# Patient Record
Sex: Male | Born: 1950 | Race: White | Hispanic: No | Marital: Married | Smoking: Current every day smoker
Health system: Southern US, Community
[De-identification: ages and names within clinical notes are randomized; demographics above are authoritative.]

## PROBLEM LIST (undated history)

## (undated) DIAGNOSIS — Z973 Presence of spectacles and contact lenses: Secondary | ICD-10-CM

## (undated) DIAGNOSIS — Z9989 Dependence on other enabling machines and devices: Secondary | ICD-10-CM

## (undated) DIAGNOSIS — R7303 Prediabetes: Secondary | ICD-10-CM

## (undated) DIAGNOSIS — G4733 Obstructive sleep apnea (adult) (pediatric): Secondary | ICD-10-CM

## (undated) DIAGNOSIS — I420 Dilated cardiomyopathy: Secondary | ICD-10-CM

## (undated) DIAGNOSIS — E7800 Pure hypercholesterolemia, unspecified: Secondary | ICD-10-CM

## (undated) DIAGNOSIS — F419 Anxiety disorder, unspecified: Secondary | ICD-10-CM

## (undated) DIAGNOSIS — R05 Cough: Secondary | ICD-10-CM

## (undated) DIAGNOSIS — R0609 Other forms of dyspnea: Secondary | ICD-10-CM

## (undated) DIAGNOSIS — J410 Simple chronic bronchitis: Secondary | ICD-10-CM

## (undated) DIAGNOSIS — K219 Gastro-esophageal reflux disease without esophagitis: Secondary | ICD-10-CM

## (undated) DIAGNOSIS — Z720 Tobacco use: Secondary | ICD-10-CM

## (undated) DIAGNOSIS — I10 Essential (primary) hypertension: Secondary | ICD-10-CM

## (undated) DIAGNOSIS — R0600 Dyspnea, unspecified: Secondary | ICD-10-CM

## (undated) HISTORY — PX: CARDIAC CATHETERIZATION: SHX172

## (undated) HISTORY — PX: UMBILICAL HERNIA REPAIR: SHX196

## (undated) HISTORY — DX: Essential (primary) hypertension: I10

## (undated) HISTORY — PX: COLONOSCOPY: SHX174

## (undated) HISTORY — PX: APPENDECTOMY: SHX54

## (undated) HISTORY — DX: Tobacco use: Z72.0

## (undated) HISTORY — DX: Pure hypercholesterolemia, unspecified: E78.00

## (undated) HISTORY — PX: TONSILLECTOMY: SUR1361

## (undated) HISTORY — PX: HEMORRHOID SURGERY: SHX153

## (undated) HISTORY — DX: Dilated cardiomyopathy: I42.0

## (undated) HISTORY — PX: TRANSTHORACIC ECHOCARDIOGRAM: SHX275

---

## 2001-03-20 ENCOUNTER — Encounter: Payer: Self-pay | Admitting: Emergency Medicine

## 2001-03-20 ENCOUNTER — Inpatient Hospital Stay (HOSPITAL_COMMUNITY): Admission: EM | Admit: 2001-03-20 | Discharge: 2001-03-26 | Payer: Self-pay | Admitting: Emergency Medicine

## 2001-03-22 ENCOUNTER — Encounter: Payer: Self-pay | Admitting: Cardiology

## 2001-03-23 ENCOUNTER — Encounter: Payer: Self-pay | Admitting: Cardiology

## 2001-03-25 ENCOUNTER — Encounter: Payer: Self-pay | Admitting: Cardiology

## 2004-11-06 ENCOUNTER — Encounter (INDEPENDENT_AMBULATORY_CARE_PROVIDER_SITE_OTHER): Payer: Self-pay | Admitting: Specialist

## 2004-11-06 ENCOUNTER — Ambulatory Visit (HOSPITAL_COMMUNITY): Admission: RE | Admit: 2004-11-06 | Discharge: 2004-11-06 | Payer: Self-pay | Admitting: General Surgery

## 2010-01-30 ENCOUNTER — Ambulatory Visit: Payer: Self-pay | Admitting: Cardiology

## 2010-02-20 ENCOUNTER — Ambulatory Visit: Admission: RE | Admit: 2010-02-20 | Discharge: 2010-02-20 | Payer: Self-pay | Source: Home / Self Care

## 2010-02-20 ENCOUNTER — Other Ambulatory Visit: Payer: Self-pay | Admitting: Cardiology

## 2010-02-20 ENCOUNTER — Ambulatory Visit (HOSPITAL_COMMUNITY)
Admission: RE | Admit: 2010-02-20 | Discharge: 2010-02-20 | Payer: Self-pay | Source: Home / Self Care | Attending: Cardiology | Admitting: Cardiology

## 2010-07-05 NOTE — H&P (Signed)
[place]. [facility]  Patient:    Antonio Fuller, Antonio Fuller Visit Number: [id number] MRN: [id number]          Service Type: MED Location: 2000 2001 01 Attending Physician:  Eleanora Neighbor Dictated by:   Jennet Maduro Earl Gala, R.N., A.N.P. Admit Date:  03/20/2001   CC:         Carola J. Gerri Spore, M.D.   History and Physical  CHIEF COMPLAINT:  Progressive dyspnea in the setting of lower chest and upper abdominal discomfort.  HISTORY OF PRESENT ILLNESS:  Antonio Fuller is a 60 year old white male who was referred from Dr. Rhona Leavens office with a two-week history of probable URI/cold.  However, over the last week he has had progressive dyspnea with just minimal exertion (i.e., just shampooing hair) as well as abdominal fullness, which he describes as somewhat of a bloating-like feeling.  He had had some amoxicillin at home when he self-medicated, with possibly some improvement; however, when he went to the physicians office this morning basically for his sinus and head congestion, his EKG there was noted to be abnormal and he was referred on to the emergency room.  He is currently pain free and in no acute distress.  PAST MEDICAL HISTORY: 1. Hypertension over the past 5-6 years. 2. Ongoing tobacco abuse. 3. Hypercholesterolemia. 4. History of appendectomy. 5. Tonsillectomy. 6. Umbilical hernia repair. 7. History of sleep apnea.  ALLERGIES:  None.  CURRENT MEDICINES: 1. Zestoretic 20-25 daily. 2. Paxil 20 mg a day. 3. Baby aspirin daily. 4. Amoxicillin recently.  SOCIAL HISTORY:  He is married.  Wife is the medical examiner.  He is a Comptroller at [employer].  For the past 15 years, he smokes one pack a day.  FAMILY HISTORY:  Both his parents are alive and in mid-39s.  Both have hypertension.  His father has had valve replacement.  His mother has a heart murmur.  He has three brothers who have hypertension and two sisters who are alive and well.  REVIEW  OF SYSTEMS:  Basically as noted above.  In general he has no prior history of chest pain, presyncope, or shortness of breath.  He has had no complaints of palpitations, no peripheral edema, no trouble with head, eyes, ears, nose, throat, no faints or seizures, negative GI/GU system.  Otherwise review of systems is as noted above.  PHYSICAL EXAMINATION:  GENERAL:  Currently in no acute distress.  VITAL SIGNS:  Blood pressure 116/73, heart rate is 92-100, respirations 18. He is afebrile.  O2 saturations 97%.  NECK:  Supple.  LUNGS:  Show a few crackles in the bases.  CARDIAC:  Cardiac exam shows a tachycardic rhythm, yet regular.  ABDOMEN:  Obese, yet soft.  Positive bowel sounds without masses.  EXTREMITIES:  Without edema.  NEUROLOGIC:  Intact.  LABORATORY:  Labs are all pending.  His EKG from Dr. Rhona Leavens office does show ST elevation in leads V2 and V3 with ST depression in V5 and V6.  These are new from previous tracings in 1999.  However, EKG here in the emergency room is nonacute at this point in time and just shows nonspecific changes.  OVERALL IMPRESSION: 1. Progressive dyspnea with abdominal and lower chest fullness. 2. Abnormal EKG. 3. Recent upper respiratory infection. 4. Hypertension. 5. Ongoing tobacco abuse. 6. Hypercholesterolemia.  PLAN:  Will go ahead and admit for evaluation to telemetry.  Will start IV nitroglycerin and heparin.  Lab studies have been drawn and are currently pending.  Further plan to  follow per Dr. Angelina Pih discretion. Dictated by:   Jennet Maduro Earl Gala, R.N., A.N.P. Attending Physician:  Eleanora Neighbor DD:  03/20/01 TD:  03/20/01 Job: [id number]

## 2010-07-05 NOTE — Discharge Summary (Signed)
[place]. [facility]  Patient:    Antonio Fuller, Antonio Fuller Visit Number: [id number] MRN: [id number]          Service Type: MED Location: 2000 2001 01 Attending Physician:  Eleanora Neighbor Dictated by:   Jennet Maduro Earl Gala, R.N., A.N.P. Admit Date:  03/20/2001 Discharge Date: 03/26/2001   CC:         [facility]  Carola J. Gerri Spore, M.D.   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  SECONDARY DISCHARGE DIAGNOSES: 1. Recent upper respiratory infection/bronchitis. 2. Ongoing substance abuse (tobacco and alcohol). 3. Hypertensive heart disease.  HISTORY OF PRESENT ILLNESS:  Antonio Fuller is a 60 year old white male who was referred from Dr. Dietrich Pates office after having a two week history of cold-like symptoms.  He had also had a feeling of abdominal fullness and bloating and had been self-medicating with amoxicillin with basically no improvement.  When he was seen in Dr. Dietrich Pates office, he was noted to have an abnormal EKG and was referred on to the emergency room.  Please see the dictated history and physical for the patient presentation and profile.  LABORATORY DATA:  On admission, his cardiac enzymes were negative.  He had a B-peptide drawn which was greater than 1300, however.  His chemistries on admission showed sodium 142, potassium 3.9, chloride 109, CO2 24, BUN 22, creatinine 1.0, and a glucose of 109.  His AST was elevated at 60, ALT was 90.  Chest x-ray showed cardiomegaly with moderate bilateral edema.  HOSPITAL COURSE:  The patient was referred on for admission to the hospital. He was initially placed on IV nitroglycerin and IV heparin.  He did rule out negative for myocardial infarction per serial enzymes.  A B-peptide was drawn and was noted to be greater than 1300.  He was referred  on for cardiac catheterization on 03/22/01 which demonstrated an enlarged left ventricle with an ejection fraction of approximately 20 percent.  The left ventricular end diastolic pressure was approximately 40.  The coronaries were noted to be normal.  At that time, it was felt that Antonio Fuller was suffering from a dilated cardiomyopathy and the plan was to continue supportive therapy with diuresis.  He was diuresed with a very nice response.  He has lost approximately 15 pounds now during this admission.  His potassium has been replaced accordingly.  We have proceeded on with 2-D echocardiogram as well as MUGA scan to further quantify this problem.  Two-D echocardiogram showed an ejection fraction estimated in the range of 15-25 percent.  There was severe, diffuse, left ventricular hypokinesis with moderate mitral valvular regurgitation.  The left atrium was mildly to moderately dilated and the right ventricular systolic function was mildly reduced as well.  A resting cardiac MUGA scan showed cardiomegaly and diffuse left ventricular hypokinesis.  The calculated ejection fraction was 15 percent.  Today on 03/26/01, the patient has done well.  He has diuresed quite nicely and is perhaps a little bit "on the dry" side.  BUN is now 33 with a creatinine of 1.2.  Potassium remains satisfactory.  He has been loaded with Coumadin and INR is currently subtherapeutic but we will continue to follow this as an outpatient.  His blood pressure has ranged anywhere from 80-100 systolic and he has remained asymptomatic.  He is currently tolerating his medical regimen well and has had no recurrence of shortness of breath  and is felt to be stable for discharge today.  DISCHARGE CONDITION:  Stable.  DISCHARGE MEDICATIONS:  We will be stopping the Zestoretic he previously took. He will start Coumadin 5 mg a day, Zestril 20 mg a day, Paxil 20 mg a day, potassium 20 mEq b.i.d., Lasix 40 mg each day to be  started on Saturday, Lanoxin 0.25 mg daily, and an aspirin daily until he has been told to stop.  His activity is to be light over the next several weeks.  Diet is no extra salt.  SPECIAL INSTRUCTIONS:  He is to weigh himself daily and to record those readings.  He will come to the office on Monday and Thursday next week for protimes.  He is strongly encouraged not to smoke as well as not to consume any alcohol products.  He will be seen in the office next Wednesday at 11 a.m. or sooner if any problems would arise. Dictated by:   Jennet Maduro Earl Gala, R.N., A.N.P. Attending Physician:  Eleanora Neighbor DD:  03/26/01 TD:  03/27/01 Job: [id number]

## 2010-07-05 NOTE — Op Note (Signed)
NAMEMARSHON, Antonio Fuller               ACCOUNT NO.:  [id number]   MEDICAL RECORD NO.:  [id number]          PATIENT TYPE:  AMB   LOCATION:  SDS                          FACILITY:  [facility]   PHYSICIAN:  Sharlet Salina T. Hoxworth, M.D.DATE OF BIRTH:  September 06, 1950   DATE OF PROCEDURE:  11/06/2004  DATE OF DISCHARGE:  11/06/2004                                 OPERATIVE REPORT   PREOPERATIVE DIAGNOSIS:  Thrombosed prolapsed hemorrhoid.   POSTOP DIAGNOSIS:  Thrombosed prolapsed hemorrhoid.   PROCEDURE:  Hemorrhoidectomy.   SURGEON:  Lorne Skeens. Hoxworth, M.D.   ANESTHESIA:  General.   BRIEF HISTORY:  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  Examination reveals a large thrombosed a  partially necrotic prolapsed internal hemorrhoid. Hemorrhoidectomy under  general anesthesia been recommended. The nature of the procedure,  indications, risks of bleeding, infection were discussed and understood. Now  brought to operating room for this procedure.   DESCRIPTION OF PROCEDURE:  The patient was brought to the operating room,  placed supine position on operating table and laryngeal mask general  anesthesia was induced. He is carefully positioned in lithotomy position.  The perineum sterilely prepped and draped. A rectal retractor was placed and  examination of the anal canal showed a single large prolapsed thrombosed  internal hemorrhoid in the right lateral position. There was some moderate  diffuse internal hemorrhoids but nothing particularly severe at this time. I  felt that this would be best addressed with a simple hemorrhoidectomy of the  large prolapsed thrombosed hemorrhoid and subsequent PPH procedure if  necessary to deal with his chronic symptoms. A 2-0 chromic stitch was placed  at the apex of the hemorrhoid group. An elliptical excision was then  performed, dissecting  this off of the sphincter carefully protecting it and  the entire hemorrhoid group removed. The wound was then closed with the  chromic suture in running lock fashion. There was complete hemostasis.  The  area was infiltrated with Marcaine.  Sponge, needle and instrument counts were correct. Sterile dressing was  applied. The patient taken recovery in satisfactory condition.      Lorne Skeens. Hoxworth, M.D.  Electronically Signed     BTH/MEDQ  D:  11/06/2004  T:  11/06/2004  Job:  [id number]

## 2010-07-05 NOTE — Cardiovascular Report (Signed)
[place]. [facility]  Patient:    Antonio Fuller, Antonio Fuller Visit Number: [id number] MRN: [id number]          Service Type: MED Location: 2000 2001 01 Attending Physician:  Eleanora Neighbor Dictated by:   Colleen Can. Deborah Chalk, M.D. Proc. Date: 03/22/01 Admit Date:  03/20/2001   CC:         [facility]  [facility]   Cardiac Catheterization  HISTORY: The patient is a 60 year old male, presented with progressive shortness of breath. He is referred for evaluation of coronary anatomy.  PROCEDURE: Left heart catheterization with selective coronary angiography, left ventricular angiography.  TYPE AND SITE OF ENTRY: Percutaneous right femoral artery with Perclose.  CATHETERS: A 6 French 4 curved Judkins right and left coronary catheters, 6 French pigtail ventriculographic catheter.  CONTRAST MATERIAL: Omnipaque.  MEDICATIONS GIVEN PRIOR TO THE PROCEDURE: Valium 10 mg p.o.  MEDICATIONS GIVEN DURING THE PROCEDURE: Versed 5 mg IV.  COMMENTS: The patient tolerated the procedure well.  HEMODYNAMIC DATA: The aortic pressure was 106/78, LV pressure was 107/40.  The pressure was zeroed and this elevated end-diastolic pressure is confirmed.  ANGIOGRAPHIC DATA: 1. Right coronary artery is a dominant vessel. It is normal. 2. Left main coronary artery is normal. 3. Left circumflex is a reasonably large vessel. It is normal. 4. Left anterior descending: The left anterior descending is a reasonably    large vessel that crosses the apex. It is normal.  LEFT VENTRICULAR ANGIOGRAM: Left ventricular angiogram was performed in the RAO position using 35 cc of contrast at 15 cc/sec. The cardiac silhouette was markedly enlarged even on the lower field. There was global hypokinesis. The global ejection fraction would be estimated to be 20%. There was no intracardiac filling defect, intracardiac calcification. There is equivocal mitral regurgitation  present.  OVERALL IMPRESSION: 1. Severe left ventricular dysfunction in a global manner. 2. Normal coronary arteries.  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 Dictated by:   Colleen Can Deborah Chalk, M.D. Attending Physician:  Eleanora Neighbor DD:  03/22/01 TD:  03/23/01 Job: [id number]

## 2010-09-12 ENCOUNTER — Other Ambulatory Visit: Payer: Self-pay | Admitting: Cardiology

## 2010-09-12 NOTE — Telephone Encounter (Signed)
escribe medication per fax request  

## 2010-09-21 ENCOUNTER — Other Ambulatory Visit: Payer: Self-pay | Admitting: Cardiology

## 2010-09-23 ENCOUNTER — Other Ambulatory Visit: Payer: Self-pay | Admitting: *Deleted

## 2010-09-23 MED ORDER — ATORVASTATIN CALCIUM 20 MG PO TABS: 20.0000 mg | ORAL_TABLET | Freq: Every day | ORAL | Status: DC

## 2010-09-23 MED ORDER — EZETIMIBE 10 MG PO TABS: 10.0000 mg | ORAL_TABLET | Freq: Every day | ORAL | Status: DC

## 2010-09-23 NOTE — Telephone Encounter (Signed)
Fax received from pharmacy. Refill completed. Jodette Nafeesah Lapaglia RN  

## 2010-10-30 ENCOUNTER — Encounter: Payer: Self-pay | Admitting: Cardiology

## 2010-10-31 ENCOUNTER — Ambulatory Visit (INDEPENDENT_AMBULATORY_CARE_PROVIDER_SITE_OTHER): Payer: Self-pay | Admitting: Cardiology

## 2010-10-31 ENCOUNTER — Encounter: Payer: Self-pay | Admitting: Cardiology

## 2010-10-31 DIAGNOSIS — I5022 Chronic systolic (congestive) heart failure: Secondary | ICD-10-CM

## 2010-10-31 DIAGNOSIS — F17200 Nicotine dependence, unspecified, uncomplicated: Secondary | ICD-10-CM

## 2010-10-31 DIAGNOSIS — I428 Other cardiomyopathies: Secondary | ICD-10-CM

## 2010-10-31 DIAGNOSIS — I10 Essential (primary) hypertension: Secondary | ICD-10-CM

## 2010-10-31 DIAGNOSIS — Z79899 Other long term (current) drug therapy: Secondary | ICD-10-CM

## 2010-10-31 DIAGNOSIS — Z720 Tobacco use: Secondary | ICD-10-CM

## 2010-10-31 DIAGNOSIS — I420 Dilated cardiomyopathy: Secondary | ICD-10-CM

## 2010-10-31 DIAGNOSIS — E7800 Pure hypercholesterolemia, unspecified: Secondary | ICD-10-CM

## 2010-10-31 DIAGNOSIS — E669 Obesity, unspecified: Secondary | ICD-10-CM

## 2010-10-31 DIAGNOSIS — G4730 Sleep apnea, unspecified: Secondary | ICD-10-CM

## 2010-10-31 NOTE — Assessment & Plan Note (Signed)
The patient understands the need to lose weight with diet and exercise. We have discussed specific strategies for this.  

## 2010-10-31 NOTE — Patient Instructions (Signed)
Your physician has requested that you have an echocardiogram in 6 months. Echocardiography is a painless test that uses sound waves to create images of your heart. It provides your doctor with information about the size and shape of your heart and how well your heart's chambers and valves are working. This procedure takes approximately one hour. There are no restrictions for this procedure.  Please come fasting in 6 months for blood work to check your cholesterol.  The current medical regimen is effective;  continue present plan and medications.  Follow up in 6 months with Dr Antoine Poche.  You will receive a letter in the mail 2 months before you are due.  Please call us when you receive this letter to schedule your follow up appointment.

## 2010-10-31 NOTE — Assessment & Plan Note (Signed)
For now he has no heart failure symptoms. He is on a reasonable regimen. We discussed the need to stop drinking alcohol completely and he thinks he can do this. We also discussed the possibility of treating his sleep apnea. He does not want a sleep consult were to consider CPAP at this time.

## 2010-10-31 NOTE — Progress Notes (Signed)
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  The LAD this was felt possibly to be related to alcohol. For a while he did stop this but he has resumed. His most recent echocardiogram in January of this year demonstrated ischemia and 45 it sent. Several years ago it was as low as 15% improving to 60% and now slightly lower than this. He however reports no symptoms. The patient denies any new symptoms such as chest discomfort, neck or arm discomfort. There has been no new shortness of breath, PND or orthopnea. There have been no reported palpitations, presyncope or syncope.  He continues to smoke.  He does not exercise.  No Known Allergies  Current Outpatient Prescriptions  Medication Sig Dispense Refill  . aspirin 81 MG tablet Take 81 mg by mouth daily.        Marland Kitchen atorvastatin (LIPITOR) 20 MG tablet take 1 tablet by mouth once daily  30 tablet  01  . carvedilol (COREG) 25 MG tablet Take 25 mg by mouth 2 (two) times daily with a meal.        . digoxin (LANOXIN) 0.25 MG tablet Take 250 mcg by mouth daily.        Marland Kitchen ezetimibe (ZETIA) 10 MG tablet Take 1 tablet (10 mg total) by mouth daily.  30 tablet  01  . furosemide (LASIX) 40 MG tablet take 1 tablet by mouth once daily  30 tablet  4  . lisinopril (PRINIVIL,ZESTRIL) 20 MG tablet Take 20 mg by mouth daily.        . Multiple Vitamin (MULTIVITAMIN) tablet Take 1 tablet by mouth daily.        Marland Kitchen PARoxetine (PAXIL-CR) 25 MG 24 hr tablet Take 25 mg by mouth 2 (two) times daily.        . potassium chloride (KLOR-CON) 20 MEQ packet Take 20 mEq by mouth daily.          Past Medical History  Diagnosis Date  . Hypertension   . Tobacco user   . Hypercholesteremia   . Sleep apnea     Past Surgical History  Procedure Date  . Tonsillectomy   . Appendectomy   . Hernia repair   . Appendectomy     ROS:  As stated in the HPI and negative for all other systems.  PHYSICAL EXAM BP 130/91  Pulse 74  Resp 18  Ht 6\' 2"  (1.88 m)   Wt 247 lb (112.038 kg)  BMI 31.71 kg/m2 GENERAL:  Well appearing HEENT:  Pupils equal round and reactive, fundi not visualized, oral mucosa unremarkable NECK:  No jugular venous distention, waveform within normal limits, carotid upstroke brisk and symmetric, no bruits, no thyromegaly LYMPHATICS:  No cervical, inguinal adenopathy LUNGS:  Clear to auscultation bilaterally BACK:  No CVA tenderness CHEST:  Unremarkable HEART:  PMI not displaced or sustained,S1 and S2 within normal limits, no S3, no S4, no clicks, no rubs, no murmurs ABD:  Flat, positive bowel sounds normal in frequency in pitch, no bruits, no rebound, no guarding, no midline pulsatile mass, no hepatomegaly, no splenomegaly EXT:  2 plus pulses throughout, no edema, no cyanosis no clubbing SKIN:  No rashes no nodules NEURO:  Cranial nerves II through XII grossly intact, motor grossly intact throughout PSYCH:  Cognitively intact, oriented to person place and time  EKG:  Sinus rhythm, rate 77, axis within normal limits, left ventricular hypertrophy by voltage criteria, no acute ST-T wave changes  ASSESSMENT AND PLAN

## 2010-10-31 NOTE — Assessment & Plan Note (Signed)
The blood pressure is at target. No change in medications is indicated. We will continue with therapeutic lifestyle changes (TLC).  

## 2010-10-31 NOTE — Assessment & Plan Note (Signed)
We discussed at length the need to stop smoking.  He will consider this.

## 2011-02-13 ENCOUNTER — Other Ambulatory Visit: Payer: Self-pay | Admitting: Cardiology

## 2011-02-13 ENCOUNTER — Other Ambulatory Visit: Payer: Self-pay | Admitting: Nurse Practitioner

## 2011-02-13 MED ORDER — ATORVASTATIN CALCIUM 20 MG PO TABS: 20.0000 mg | ORAL_TABLET | Freq: Every day | ORAL | Status: DC

## 2011-02-13 MED ORDER — EZETIMIBE 10 MG PO TABS: 10.0000 mg | ORAL_TABLET | Freq: Every day | ORAL | Status: DC

## 2011-02-25 ENCOUNTER — Other Ambulatory Visit: Payer: Self-pay | Admitting: *Deleted

## 2011-02-25 MED ORDER — FUROSEMIDE 40 MG PO TABS: 40.0000 mg | ORAL_TABLET | Freq: Every day | ORAL | Status: DC

## 2011-02-27 ENCOUNTER — Other Ambulatory Visit: Payer: Self-pay | Admitting: Cardiology

## 2011-02-27 NOTE — Telephone Encounter (Signed)
Refilled lanoxin 

## 2011-03-17 ENCOUNTER — Other Ambulatory Visit: Payer: Self-pay | Admitting: Cardiology

## 2011-03-19 ENCOUNTER — Other Ambulatory Visit: Payer: Self-pay | Admitting: *Deleted

## 2011-03-19 MED ORDER — PAROXETINE HCL ER 25 MG PO TB24: 25.0000 mg | ORAL_TABLET | Freq: Two times a day (BID) | ORAL | Status: DC

## 2011-03-27 ENCOUNTER — Other Ambulatory Visit: Payer: Self-pay | Admitting: Cardiology

## 2011-04-16 ENCOUNTER — Other Ambulatory Visit: Payer: Self-pay | Admitting: Cardiology

## 2011-04-16 NOTE — Telephone Encounter (Signed)
..   Requested Prescriptions   Pending Prescriptions Disp Refills  . atorvastatin (LIPITOR) 20 MG tablet [Pharmacy Med Name: ATORVASTATIN 20 MG TABLET] 30 tablet 0    Sig: take 1 tablet by mouth once daily  . ZETIA 10 MG tablet [Pharmacy Med Name: ZETIA 10 MG TABLET] 30 tablet 0    Sig: take 1 tablet by mouth once daily

## 2011-05-19 ENCOUNTER — Other Ambulatory Visit: Payer: Self-pay | Admitting: Cardiology

## 2011-07-26 ENCOUNTER — Other Ambulatory Visit: Payer: Self-pay | Admitting: Cardiology

## 2011-08-19 ENCOUNTER — Other Ambulatory Visit: Payer: Self-pay | Admitting: Cardiology

## 2011-08-21 ENCOUNTER — Other Ambulatory Visit: Payer: Self-pay | Admitting: Cardiology

## 2011-08-22 ENCOUNTER — Other Ambulatory Visit: Payer: Self-pay | Admitting: *Deleted

## 2011-08-22 NOTE — Telephone Encounter (Signed)
Not sure if Dr. Antoine Poche refills paxil will route to Nurse

## 2011-10-01 ENCOUNTER — Other Ambulatory Visit: Payer: Self-pay | Admitting: Cardiology

## 2011-10-02 ENCOUNTER — Telehealth: Payer: Self-pay

## 2011-10-02 NOTE — Telephone Encounter (Signed)
..   Requested Prescriptions   Pending Prescriptions Disp Refills  . LANOXIN 0.25 MG tablet [Pharmacy Med Name: LANOXIN 250MCG(0.25MG ) TABLET] 30 tablet 1    Sig: take 1 tablet by mouth once daily  .Marland KitchenPatient needs to contact office to schedule  Appointment  for future refills.Ph:[phone]. Thank you.

## 2011-10-17 ENCOUNTER — Other Ambulatory Visit: Payer: Self-pay | Admitting: Cardiology

## 2011-10-17 NOTE — Telephone Encounter (Signed)
Pt needs appointment then refill can be made Fax Received. Refill Completed. Eagle Pitta Chowoe (R.M.A)   

## 2011-11-01 ENCOUNTER — Other Ambulatory Visit: Payer: Self-pay | Admitting: Cardiology

## 2011-11-03 ENCOUNTER — Telehealth: Payer: Self-pay

## 2011-11-03 NOTE — Telephone Encounter (Signed)
Spoke with patient to make appointment for oct 11 At 400pm

## 2011-11-03 NOTE — Telephone Encounter (Signed)
..   Requested Prescriptions   Pending Prescriptions Disp Refills  . LANOXIN 0.25 MG tablet [Pharmacy Med Name: LANOXIN 250MCG(0.25MG ) TABLET] 30 each 2    Sig: take 1 tablet by mouth once daily  Spoke with patient to make appointment for oct 11 At 400pm

## 2011-11-28 ENCOUNTER — Encounter: Payer: Self-pay | Admitting: Cardiology

## 2011-11-28 ENCOUNTER — Ambulatory Visit (INDEPENDENT_AMBULATORY_CARE_PROVIDER_SITE_OTHER): Payer: BC Managed Care – PPO | Admitting: Cardiology

## 2011-11-28 VITALS — BP 102/80 | HR 73 | Ht 74.0 in | Wt 241.8 lb

## 2011-11-28 DIAGNOSIS — I428 Other cardiomyopathies: Secondary | ICD-10-CM

## 2011-11-28 NOTE — Progress Notes (Signed)
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  The etiology this was felt possibly to be related to alcohol.   His most recent echocardiogram in January of last year demonstrated an EF of 45%. Several years ago it was as low as 15% improving at one point to 60%.   Since I last saw him he has done well.  The patient denies any new symptoms such as chest discomfort, neck or arm discomfort. There has been no new shortness of breath, PND or orthopnea. There have been no reported palpitations, presyncope or syncope.  He continues to smoke.  However, he stop drinking and has had nothing to drink in a month. He's lost some weight. He is starting to exercise.  No Known Allergies  Current Outpatient Prescriptions  Medication Sig Dispense Refill  . aspirin 81 MG tablet Take 81 mg by mouth daily.        Marland Kitchen atorvastatin (LIPITOR) 20 MG tablet Take 1 tablet (20 mg total) by mouth daily.  30 tablet  1  . carvedilol (COREG) 25 MG tablet take 1 tablet by mouth twice a day  60 tablet  1  . fluticasone (FLONASE) 50 MCG/ACT nasal spray       . furosemide (LASIX) 40 MG tablet take 1 tablet by mouth once daily  30 tablet  9  . LANOXIN 0.25 MG tablet take 1 tablet by mouth once daily  30 tablet  1  . lisinopril (PRINIVIL,ZESTRIL) 20 MG tablet take 1 tablet by mouth once daily  30 tablet  1  . Multiple Vitamin (MULTIVITAMIN) tablet Take 1 tablet by mouth daily.        Marland Kitchen PARoxetine (PAXIL-CR) 25 MG 24 hr tablet Take 1 tablet (25 mg total) by mouth 2 (two) times daily.  60 tablet  2  . potassium chloride (KLOR-CON) 20 MEQ packet Take 20 mEq by mouth daily.        Marland Kitchen ZETIA 10 MG tablet take 1 tablet by mouth once daily  30 tablet  6  . DISCONTD: atorvastatin (LIPITOR) 20 MG tablet take 1 tablet by mouth once daily  30 tablet  6  . DISCONTD: LANOXIN 0.25 MG tablet take 1 tablet by mouth once daily  30 each  2    Past Medical History  Diagnosis Date  . Hypertension   . Tobacco user   .  Hypercholesteremia   . Sleep apnea     No CPAP  . Cardiomyopathy, dilated, nonischemic     Past Surgical History  Procedure Date  . Tonsillectomy   . Appendectomy   . Hernia repair     ROS:  Chronic sinus problems.  Otherwise as stated in the HPI and negative for all other systems.  PHYSICAL EXAM BP 102/80  Pulse 73  Ht 6\' 2"  (1.88 m)  Wt 241 lb 12.8 oz (109.68 kg)  BMI 31.05 kg/m2 GENERAL:  Well appearing HEENT:  Pupils equal round and reactive, fundi not visualized, oral mucosa unremarkable NECK:  No jugular venous distention, waveform within normal limits, carotid upstroke brisk and symmetric, no bruits, no thyromegaly LYMPHATICS:  No cervical, inguinal adenopathy LUNGS:  Clear to auscultation bilaterally BACK:  No CVA tenderness CHEST:  Unremarkable HEART:  PMI not displaced or sustained,S1 and S2 within normal limits, no S3, no S4, no clicks, no rubs, no murmurs ABD:  Flat, positive bowel sounds normal in frequency in pitch, no bruits, no rebound, no guarding, no midline pulsatile mass, no hepatomegaly, no  splenomegaly EXT:  2 plus pulses throughout, no edema, no cyanosis no clubbing SKIN:  No rashes no nodules NEURO:  Cranial nerves II through XII grossly intact, motor grossly intact throughout PSYCH:  Cognitively intact, oriented to person place and time  EKG:  Sinus rhythm, rate 73, axis within normal limits, left ventricular hypertrophy by voltage criteria, no acute ST-T wave changes.  11/28/2011   ASSESSMENT AND PLAN  Cardiomyopathy, dilated, nonischemic -  The patient has no new symptoms. He will continue the medications as listed. He seems to be euvolemic. No change in therapy is indicated.   Hypertension -  The blood pressure is at target. No change in medications is indicated. We will continue with therapeutic lifestyle changes (TLC).   Tobacco user -  We discussed at length the need to stop smoking. He is going to try to cut back in one vise and time and  right now cannot stop smoking.  Obesity -  I congratulated him on his weight loss and I encourage more of the same.  ETOH - I congratulated him on the fact that he has abstained from alcohol for one month and I encouraged complete continued abstinence.  Chronic sinus trouble - I did give him the name for ENT referral. In addition he needs a new primary care doctor as he is retired and I provided him with another referral.

## 2011-11-28 NOTE — Patient Instructions (Addendum)
The current medical regimen is effective;  continue present plan and medications.  Follow up in 1 year with Dr Antoine Poche.  You will receive a letter in the mail 2 months before you are due.  Please call us when you receive this letter to schedule your follow up appointment.  Dr Osborn Coho  - ENT   [phone]  Dr Blair Heys - primary care  [phone]

## 2011-12-18 ENCOUNTER — Other Ambulatory Visit: Payer: Self-pay | Admitting: Cardiology

## 2011-12-18 NOTE — Telephone Encounter (Signed)
..   Requested Prescriptions   Pending Prescriptions Disp Refills  . atorvastatin (LIPITOR) 20 MG tablet [Pharmacy Med Name: ATORVASTATIN 20 MG TABLET] 30 tablet 6    Sig: take 1 tablet by mouth once daily  . ZETIA 10 MG tablet [Pharmacy Med Name: ZETIA 10 MG TABLET] 30 each 6    Sig: take 1 tablet by mouth once daily  . lisinopril (PRINIVIL,ZESTRIL) 20 MG tablet [Pharmacy Med Name: LISINOPRIL 20 MG TABLET] 30 tablet 6    Sig: take 1 tablet by mouth once daily  . carvedilol (COREG) 25 MG tablet [Pharmacy Med Name: CARVEDILOL 25 MG TABLET] 60 tablet 6    Sig: take 1 tablet by mouth twice a day

## 2012-02-25 ENCOUNTER — Other Ambulatory Visit: Payer: Self-pay | Admitting: Cardiology

## 2012-03-27 ENCOUNTER — Other Ambulatory Visit: Payer: Self-pay | Admitting: Cardiology

## 2012-04-07 ENCOUNTER — Other Ambulatory Visit: Payer: Self-pay | Admitting: Cardiology

## 2012-04-13 ENCOUNTER — Telehealth: Payer: Self-pay | Admitting: Cardiology

## 2012-04-13 NOTE — Telephone Encounter (Signed)
Pt needs to get a pcp and Dr. Antoine Poche gave him a couple of names but he has forgotten and would like you to ask Dr. Antoine Poche and give him a call wit this information

## 2012-04-13 NOTE — Telephone Encounter (Signed)
Will ask Dr Antoine Poche and call pt back

## 2012-04-14 ENCOUNTER — Telehealth: Payer: Self-pay

## 2012-04-14 NOTE — Telephone Encounter (Signed)
Patient walked in office wanting Paroxetine 25 mg refilled.Patient was told Dr.Hochrein not in office today will forward message to him for approval.

## 2012-04-14 NOTE — Telephone Encounter (Signed)
I do not prescribe his Paxil.

## 2012-04-14 NOTE — Telephone Encounter (Signed)
I don't remember the names I gave him.  We can give him the names of MDs at the nearest [place] office to where he lives.

## 2012-04-15 MED ORDER — PAROXETINE HCL ER 25 MG PO TB24: 25.0000 mg | ORAL_TABLET | Freq: Two times a day (BID) | ORAL | Status: DC

## 2012-04-15 NOTE — Telephone Encounter (Signed)
New Prob   Pt states pharm said he can not get a refill on one of his medications until he comes in for an office visit. Would like to speak to nurse.

## 2012-04-15 NOTE — Telephone Encounter (Signed)
Please see other office note. 

## 2012-04-15 NOTE — Telephone Encounter (Signed)
Pt aware Dr Antoine Poche will not continue to refill- he must est with a PCP to continue to receive Paxil.  #60 only time only sent into pharmacy.  He was given the name of MD's Dr Antoine Poche had given him before - Dr Manus Gunning and Dr Annalee Genta for ENT issues.

## 2012-05-28 ENCOUNTER — Other Ambulatory Visit: Payer: Self-pay | Admitting: Cardiology

## 2012-07-08 ENCOUNTER — Other Ambulatory Visit: Payer: Self-pay | Admitting: Cardiology

## 2012-07-25 ENCOUNTER — Other Ambulatory Visit: Payer: Self-pay | Admitting: Cardiology

## 2012-07-30 ENCOUNTER — Other Ambulatory Visit: Payer: Self-pay | Admitting: *Deleted

## 2012-07-30 MED ORDER — LISINOPRIL 20 MG PO TABS: ORAL_TABLET | ORAL | Status: DC

## 2012-08-20 ENCOUNTER — Other Ambulatory Visit: Payer: Self-pay | Admitting: Cardiology

## 2012-09-17 ENCOUNTER — Other Ambulatory Visit: Payer: Self-pay | Admitting: Cardiology

## 2012-10-17 ENCOUNTER — Other Ambulatory Visit: Payer: Self-pay | Admitting: Cardiology

## 2012-12-17 ENCOUNTER — Other Ambulatory Visit: Payer: Self-pay | Admitting: Cardiology

## 2012-12-21 ENCOUNTER — Other Ambulatory Visit: Payer: Self-pay

## 2012-12-21 MED ORDER — CARVEDILOL 25 MG PO TABS: ORAL_TABLET | ORAL | Status: DC

## 2012-12-21 MED ORDER — ATORVASTATIN CALCIUM 20 MG PO TABS: 20.0000 mg | ORAL_TABLET | Freq: Every day | ORAL | Status: DC

## 2012-12-22 ENCOUNTER — Other Ambulatory Visit: Payer: Self-pay | Admitting: Gastroenterology

## 2013-01-26 ENCOUNTER — Other Ambulatory Visit: Payer: Self-pay | Admitting: Cardiology

## 2013-02-03 ENCOUNTER — Ambulatory Visit (INDEPENDENT_AMBULATORY_CARE_PROVIDER_SITE_OTHER): Payer: BC Managed Care – PPO | Admitting: Cardiology

## 2013-02-03 ENCOUNTER — Encounter: Payer: Self-pay | Admitting: Cardiology

## 2013-02-03 VITALS — BP 128/82 | HR 89 | Ht 74.0 in | Wt 247.8 lb

## 2013-02-03 DIAGNOSIS — I10 Essential (primary) hypertension: Secondary | ICD-10-CM

## 2013-02-03 DIAGNOSIS — G4730 Sleep apnea, unspecified: Secondary | ICD-10-CM

## 2013-02-03 DIAGNOSIS — R0989 Other specified symptoms and signs involving the circulatory and respiratory systems: Secondary | ICD-10-CM

## 2013-02-03 DIAGNOSIS — I420 Dilated cardiomyopathy: Secondary | ICD-10-CM

## 2013-02-03 DIAGNOSIS — Z720 Tobacco use: Secondary | ICD-10-CM

## 2013-02-03 DIAGNOSIS — F17200 Nicotine dependence, unspecified, uncomplicated: Secondary | ICD-10-CM

## 2013-02-03 DIAGNOSIS — I428 Other cardiomyopathies: Secondary | ICD-10-CM

## 2013-02-03 NOTE — Progress Notes (Signed)
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  The etiology this was felt possibly to be related to alcohol.   His most recent echocardiogram in January of 2012 demonstrated an EF of 45%. Several years ago it was as low as 15%.   Since I last saw him he has done OK.  The patient denies any new symptoms such as chest discomfort, neck or arm discomfort. There has been no new shortness of breath, PND or orthopnea. There have been no reported palpitations, presyncope or syncope.  He continues to smoke but is now thinking about quitting. Last year he told me that he had quit drinking ETOH.  However, he still drinks occasionally.    No Known Allergies  Current Outpatient Prescriptions  Medication Sig Dispense Refill  . aspirin 81 MG tablet Take 81 mg by mouth daily.        Marland Kitchen atorvastatin (LIPITOR) 20 MG tablet Take 1 tablet (20 mg total) by mouth daily.  30 tablet  1  . carvedilol (COREG) 25 MG tablet take 1 tablet by mouth twice a day PATIENT NEEDS to keep APPOINTMENT  60 tablet  1  . fluticasone (FLONASE) 50 MCG/ACT nasal spray       . furosemide (LASIX) 40 MG tablet take 1 tablet by mouth once daily  30 tablet  1  . KLOR-CON M20 20 MEQ tablet take 1 tablet by mouth once daily  30 tablet  5  . LANOXIN 250 MCG tablet take 1 tablet by mouth once daily  30 tablet  0  . lisinopril (PRINIVIL,ZESTRIL) 40 MG tablet Take 40 mg by mouth daily.      . Multiple Vitamin (MULTIVITAMIN) tablet Take 1 tablet by mouth daily.        Marland Kitchen PARoxetine (PAXIL-CR) 25 MG 24 hr tablet Take 1 tablet (25 mg total) by mouth 2 (two) times daily.  60 tablet  0  . ZETIA 10 MG tablet take 1 tablet by mouth once daily  30 tablet  6   No current facility-administered medications for this visit.    Past Medical History  Diagnosis Date  . Hypertension   . Tobacco user   . Hypercholesteremia   . Sleep apnea     No CPAP  . Cardiomyopathy, dilated, nonischemic     Past Surgical History  Procedure  Laterality Date  . Tonsillectomy    . Appendectomy    . Hernia repair      ROS:  Chronic sinus problems, mild ongoing chest congestion.  Otherwise as stated in the HPI and negative for all other systems.  PHYSICAL EXAM BP 128/82  Pulse 89  Ht 6\' 2"  (1.88 m)  Wt 247 lb 12.8 oz (112.401 kg)  BMI 31.80 kg/m2 GENERAL:  Well appearing HEENT:  Pupils equal round and reactive, fundi not visualized, oral mucosa unremarkable NECK:  No jugular venous distention, waveform within normal limits, carotid upstroke brisk and symmetric, no bruits, no thyromegaly LUNGS:  Clear to auscultation bilaterally BACK:  No CVA tenderness HEART:  PMI not displaced or sustained,S1 and S2 within normal limits, no S3, no S4, no clicks, no rubs, no murmurs ABD:  Flat, positive bowel sounds normal in frequency in pitch, no bruits, no rebound, no guarding, no midline pulsatile mass, no hepatomegaly, no splenomegaly EXT:  2 plus pulses throughout, no edema, no cyanosis no clubbing  EKG:  Sinus rhythm, rate 89, axis within normal limits, left ventricular hypertrophy by voltage criteria,nonspecific ST-T wave changes.  02/03/2013  ASSESSMENT AND PLAN  Cardiomyopathy, dilated, nonischemic -  The patient has no new symptoms. He will continue the medications as listed. He seems to be euvolemic. No change in therapy is indicated.   Hypertension -  The blood pressure is at target. No change in medications is indicated. We will continue with therapeutic lifestyle changes (TLC).   Tobacco user -  We discussed at length the need to stop smoking. He has a prescription for Chantix and is thinking of using this.   Obesity -  He understands the need to lose weight.   ETOH - He needs to avoid alcohol given his cardiac problems.    Chronic sinus trouble - I again gave him a name to an ENT.

## 2013-02-03 NOTE — Patient Instructions (Signed)
The current medical regimen is effective;  continue present plan and medications.  You have been referred to Dr Osborn Coho for your sinus issues.  Follow up in 1 year with Dr Antoine Poche.  You will receive a letter in the mail 2 months before you are due.  Please call us when you receive this letter to schedule your follow up appointment.

## 2013-02-08 ENCOUNTER — Other Ambulatory Visit: Payer: Self-pay | Admitting: Cardiology

## 2013-02-28 ENCOUNTER — Other Ambulatory Visit: Payer: Self-pay | Admitting: Cardiology

## 2013-03-04 ENCOUNTER — Other Ambulatory Visit: Payer: Self-pay

## 2013-03-04 MED ORDER — LANOXIN 250 MCG PO TABS: ORAL_TABLET | ORAL | Status: DC

## 2013-03-18 ENCOUNTER — Other Ambulatory Visit: Payer: Self-pay | Admitting: Cardiology

## 2013-04-12 ENCOUNTER — Other Ambulatory Visit: Payer: Self-pay | Admitting: Cardiology

## 2013-05-20 ENCOUNTER — Other Ambulatory Visit: Payer: Self-pay | Admitting: Cardiology

## 2013-06-08 ENCOUNTER — Other Ambulatory Visit: Payer: Self-pay | Admitting: Cardiology

## 2013-07-06 ENCOUNTER — Other Ambulatory Visit: Payer: Self-pay | Admitting: Cardiology

## 2013-07-19 ENCOUNTER — Other Ambulatory Visit: Payer: Self-pay | Admitting: Cardiology

## 2013-08-07 ENCOUNTER — Other Ambulatory Visit: Payer: Self-pay | Admitting: Cardiology

## 2013-09-04 ENCOUNTER — Other Ambulatory Visit: Payer: Self-pay | Admitting: Cardiology

## 2013-09-05 NOTE — Telephone Encounter (Signed)
Rollene RotundaJames Hochrein, MD at 02/03/2013  2:37 PM KLOR-CON M20 20 MEQ tablet  take 1 tablet by mouth once daily  30 tablet  ZETIA 10 MG tablet  take 1 tablet by mouth once daily  The current medical regimen is effective;  continue present plan and medications.

## 2013-09-09 ENCOUNTER — Other Ambulatory Visit: Payer: Self-pay | Admitting: Cardiology

## 2013-10-04 ENCOUNTER — Other Ambulatory Visit: Payer: Self-pay | Admitting: Cardiology

## 2013-11-13 ENCOUNTER — Other Ambulatory Visit: Payer: Self-pay | Admitting: Cardiology

## 2013-11-15 ENCOUNTER — Other Ambulatory Visit: Payer: Self-pay | Admitting: Family Medicine

## 2013-12-02 ENCOUNTER — Other Ambulatory Visit: Payer: Self-pay | Admitting: Cardiology

## 2014-01-08 ENCOUNTER — Other Ambulatory Visit: Payer: Self-pay | Admitting: Cardiology

## 2014-02-02 ENCOUNTER — Other Ambulatory Visit: Payer: Self-pay | Admitting: Cardiology

## 2014-02-07 ENCOUNTER — Other Ambulatory Visit: Payer: Self-pay | Admitting: Cardiology

## 2014-02-20 ENCOUNTER — Ambulatory Visit (INDEPENDENT_AMBULATORY_CARE_PROVIDER_SITE_OTHER): Payer: BLUE CROSS/BLUE SHIELD | Admitting: Cardiology

## 2014-02-20 ENCOUNTER — Other Ambulatory Visit: Payer: Self-pay | Admitting: *Deleted

## 2014-02-20 ENCOUNTER — Encounter: Payer: Self-pay | Admitting: Cardiology

## 2014-02-20 VITALS — BP 130/84 | HR 67 | Ht 74.0 in | Wt 233.5 lb

## 2014-02-20 DIAGNOSIS — I420 Dilated cardiomyopathy: Secondary | ICD-10-CM

## 2014-02-20 NOTE — Patient Instructions (Signed)
Your physician recommends that you schedule a follow-up appointment in: one year with Dr. Hochrein  We have ordered an echo for you to get done   

## 2014-02-20 NOTE — Progress Notes (Signed)
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  The etiology this was felt possibly to be related to alcohol.   His most recent echocardiogram in January of 2012 demonstrated an EF of 45%. Several years ago it was as low as 15%.   Since I last saw him he has done OK.  The patient denies any new symptoms such as chest discomfort, neck or arm discomfort. There has been no new shortness of breath, PND or orthopnea. There have been no reported palpitations, presyncope or syncope.   He has lost some weight and is doing some exercising routinely.  He still smokes however.  I did review his outside lipids and A1c and they were at target.  No Known Allergies  Current Outpatient Prescriptions  Medication Sig Dispense Refill  . aspirin 81 MG tablet Take 81 mg by mouth daily.      Marland Kitchen atorvastatin (LIPITOR) 20 MG tablet take 1 tablet by mouth once daily 30 tablet 1  . carvedilol (COREG) 25 MG tablet take 1 tablet by mouth twice a day 60 tablet 1  . fluticasone (FLONASE) 50 MCG/ACT nasal spray     . furosemide (LASIX) 40 MG tablet TAKE 1 TABLET ONCE DAILY 30 tablet 0  . LANOXIN 250 MCG tablet take 1 tablet by mouth once daily 30 tablet 0  . lisinopril (PRINIVIL,ZESTRIL) 40 MG tablet Take 40 mg by mouth daily.    . Multiple Vitamin (MULTIVITAMIN) tablet Take 1 tablet by mouth daily.      Marland Kitchen PARoxetine (PAXIL-CR) 25 MG 24 hr tablet Take 1 tablet (25 mg total) by mouth 2 (two) times daily. 60 tablet 0  . potassium chloride SA (K-DUR,KLOR-CON) 20 MEQ tablet take 1 tablet by mouth once daily 30 tablet 5  . ZETIA 10 MG tablet take 1 tablet by mouth once daily 30 tablet 6   No current facility-administered medications for this visit.    Past Medical History  Diagnosis Date  . Hypertension   . Tobacco user   . Hypercholesteremia   . Sleep apnea     No CPAP  . Cardiomyopathy, dilated, nonischemic     Past Surgical History  Procedure Laterality Date  . Tonsillectomy    .  Appendectomy    . Hernia repair      ROS:  Chronic sinus problems, mild ongoing chest congestion.  Otherwise as stated in the HPI and negative for all other systems.  PHYSICAL EXAM BP 130/84 mmHg  Pulse 67  Ht  (1.88 m)  Wt 233 lb 8 oz (105.915 kg)  BMI 29.97 kg/m2 GENERAL:  Well appearing HEENT:  Pupils equal round and reactive, fundi not visualized, oral mucosa unremarkable NECK:  No jugular venous distention, waveform within normal limits, carotid upstroke brisk and symmetric, no bruits, no thyromegaly LUNGS:  Clear to auscultation bilaterally BACK:  No CVA tenderness HEART:  PMI not displaced or sustained,S1 and S2 within normal limits, no S3, no S4, no clicks, no rubs, no murmurs ABD:  Flat, positive bowel sounds normal in frequency in pitch, no bruits, no rebound, no guarding, no midline pulsatile mass, no hepatomegaly, no splenomegaly EXT:  2 plus pulses throughout, no edema, no cyanosis no clubbing  EKG:  Sinus rhythm, rate 67, axis within normal limits, left ventricular hypertrophy by voltage criteria,nonspecific ST-T wave changes.  02/20/2014  ASSESSMENT AND PLAN  Cardiomyopathy, dilated, nonischemic -  The patient has no new symptoms. It has been four years since his echo and I  will repeat this.    Hypertension -  The blood pressure is at target. No change in medications is indicated. We will continue with therapeutic lifestyle changes (TLC).   Tobacco user -  We discussed again the need to stop smoking.  He has had a prescription for Chantix but has never used this.   Obesity -  He has lost weight and I applauded this.   ETOH - He needs to avoid alcohol given his cardiac problems.

## 2014-02-23 ENCOUNTER — Ambulatory Visit (HOSPITAL_COMMUNITY)
Admission: RE | Admit: 2014-02-23 | Discharge: 2014-02-23 | Disposition: A | Discharge status: Home / Self Care | Payer: BLUE CROSS/BLUE SHIELD | Source: Ambulatory Visit | Attending: Cardiovascular Disease | Admitting: Cardiovascular Disease

## 2014-02-23 DIAGNOSIS — I429 Cardiomyopathy, unspecified: Secondary | ICD-10-CM | POA: Insufficient documentation

## 2014-02-23 DIAGNOSIS — I10 Essential (primary) hypertension: Secondary | ICD-10-CM | POA: Insufficient documentation

## 2014-02-23 DIAGNOSIS — Z720 Tobacco use: Secondary | ICD-10-CM | POA: Diagnosis not present

## 2014-02-23 DIAGNOSIS — E785 Hyperlipidemia, unspecified: Secondary | ICD-10-CM | POA: Diagnosis not present

## 2014-02-23 DIAGNOSIS — I517 Cardiomegaly: Secondary | ICD-10-CM

## 2014-02-23 DIAGNOSIS — I420 Dilated cardiomyopathy: Secondary | ICD-10-CM

## 2014-02-23 NOTE — Progress Notes (Signed)
2D Echocardiogram Complete.  02/23/2014   Angel Weedon, RDCS  

## 2014-03-06 ENCOUNTER — Other Ambulatory Visit: Payer: Self-pay | Admitting: Cardiology

## 2014-03-07 ENCOUNTER — Encounter: Payer: Self-pay | Admitting: *Deleted

## 2014-03-07 ENCOUNTER — Telehealth: Payer: Self-pay | Admitting: Cardiology

## 2014-03-07 NOTE — Telephone Encounter (Signed)
Pt returning call for his echo from a few days ago.

## 2014-03-07 NOTE — Telephone Encounter (Signed)
Dr. Jenene SlickerHochrein's summary of results read, patient voiced understanding.

## 2014-03-18 ENCOUNTER — Other Ambulatory Visit: Payer: Self-pay | Admitting: Cardiology

## 2014-03-20 NOTE — Telephone Encounter (Signed)
Rx refill sent to patient pharmacy   

## 2014-04-02 ENCOUNTER — Other Ambulatory Visit: Payer: Self-pay | Admitting: Cardiology

## 2014-04-03 ENCOUNTER — Other Ambulatory Visit: Payer: Self-pay | Admitting: Cardiology

## 2014-05-01 ENCOUNTER — Encounter: Payer: Self-pay | Admitting: *Deleted

## 2014-05-03 ENCOUNTER — Other Ambulatory Visit: Payer: Self-pay

## 2014-05-03 MED ORDER — LANOXIN 250 MCG PO TABS: 250.0000 ug | ORAL_TABLET | Freq: Every day | ORAL | Status: DC

## 2014-05-22 ENCOUNTER — Other Ambulatory Visit: Payer: Self-pay | Admitting: Cardiology

## 2014-12-26 ENCOUNTER — Other Ambulatory Visit: Payer: Self-pay | Admitting: Cardiology

## 2015-02-24 ENCOUNTER — Other Ambulatory Visit: Payer: Self-pay | Admitting: Cardiology

## 2015-02-26 NOTE — Telephone Encounter (Signed)
Rx request sent to pharmacy.  

## 2015-03-02 ENCOUNTER — Ambulatory Visit (INDEPENDENT_AMBULATORY_CARE_PROVIDER_SITE_OTHER): Payer: BLUE CROSS/BLUE SHIELD | Admitting: Cardiology

## 2015-03-02 ENCOUNTER — Encounter: Payer: Self-pay | Admitting: Cardiology

## 2015-03-02 VITALS — BP 126/76 | HR 72 | Ht 74.0 in | Wt 235.4 lb

## 2015-03-02 DIAGNOSIS — I420 Dilated cardiomyopathy: Secondary | ICD-10-CM

## 2015-03-02 NOTE — Patient Instructions (Signed)

## 2015-03-02 NOTE — Progress Notes (Signed)
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  The etiology this was felt possibly to be related to alcohol.   His most recent echocardiogram in January of 2012 demonstrated an EF of 45%. Several years ago it was as low as 15%.   Since I last saw him he has done OK.  His mother died.  The patient denies any new symptoms such as chest discomfort, neck or arm discomfort. There has been no new shortness of breath, PND or orthopnea. There have been no reported palpitations, presyncope or syncope.     No Known Allergies  Current Outpatient Prescriptions  Medication Sig Dispense Refill  . aspirin 81 MG tablet Take 81 mg by mouth daily.      Marland Kitchen atorvastatin (LIPITOR) 20 MG tablet take 1 tablet by mouth once daily 30 tablet 0  . carvedilol (COREG) 25 MG tablet take 1 tablet by mouth twice a day 60 tablet 0  . fluticasone (FLONASE) 50 MCG/ACT nasal spray     . furosemide (LASIX) 40 MG tablet take 1 tablet by mouth once daily 30 tablet 0  . LANOXIN 250 MCG tablet Take 1 tablet (250 mcg total) by mouth daily. 30 tablet 11  . lisinopril (PRINIVIL,ZESTRIL) 40 MG tablet Take 40 mg by mouth daily.    . Multiple Vitamin (MULTIVITAMIN) tablet Take 1 tablet by mouth daily.      Marland Kitchen PARoxetine (PAXIL-CR) 25 MG 24 hr tablet Take 1 tablet (25 mg total) by mouth 2 (two) times daily. 60 tablet 0  . potassium chloride SA (K-DUR,KLOR-CON) 20 MEQ tablet take 1 tablet by mouth once daily 30 tablet 0  . ZETIA 10 MG tablet take 1 tablet by mouth once daily 30 tablet 0   No current facility-administered medications for this visit.    Past Medical History  Diagnosis Date  . Hypertension   . Tobacco user   . Hypercholesteremia   . Sleep apnea     No CPAP  . Cardiomyopathy, dilated, nonischemic [facility])     Past Surgical History  Procedure Laterality Date  . Tonsillectomy    . Appendectomy    . Hernia repair      ROS:  Chronic sinus problems.  Otherwise as stated in the HPI and  negative for all other systems.  PHYSICAL EXAM BP 126/76 mmHg  Pulse 72  Ht 6\' 2"  (1.88 m)  Wt 235 lb 6 oz (106.765 kg)  BMI 30.21 kg/m2 GENERAL:  Well appearing HEENT:  Pupils equal round and reactive, fundi not visualized, oral mucosa unremarkable NECK:  No jugular venous distention, waveform within normal limits, carotid upstroke brisk and symmetric, no bruits, no thyromegaly LUNGS:  Clear to auscultation bilaterally BACK:  No CVA tenderness HEART:  PMI not displaced or sustained,S1 and S2 within normal limits, no S3, no S4, no clicks, no rubs, no murmurs ABD:  Flat, positive bowel sounds normal in frequency in pitch, no bruits, no rebound, no guarding, no midline pulsatile mass, no hepatomegaly, no splenomegaly EXT:  2 plus pulses throughout, no edema, no cyanosis no clubbing  EKG:  Sinus rhythm, rate 72, axis within normal limits, left ventricular hypertrophy by voltage criteria,nonspecific ST-T wave changes.  03/02/2015  ASSESSMENT AND PLAN  Cardiomyopathy, dilated, nonischemic -  He has no new symptoms.  No change in therapy is planned.  No further imaging is planned.   Hypertension -  The blood pressure is at target. No change in medications is indicated. We will continue with therapeutic  lifestyle changes (TLC).   Tobacco user -  We discussed again the need to stop smoking.  He did not tolerate Chantix.  I would suggest nicotine replacement  ETOH - He needs to avoid alcohol given his cardiac problems.   He does still drink occasionally on the weekend.

## 2015-03-26 ENCOUNTER — Other Ambulatory Visit: Payer: Self-pay | Admitting: *Deleted

## 2015-03-26 MED ORDER — FUROSEMIDE 40 MG PO TABS: 40.0000 mg | ORAL_TABLET | Freq: Every day | ORAL | Status: DC

## 2015-03-26 MED ORDER — EZETIMIBE 10 MG PO TABS
10.0000 mg | ORAL_TABLET | Freq: Every day | ORAL | Status: DC
Start: 2015-03-26 — End: 2016-02-25

## 2015-03-26 MED ORDER — POTASSIUM CHLORIDE CRYS ER 20 MEQ PO TBCR: 20.0000 meq | EXTENDED_RELEASE_TABLET | Freq: Every day | ORAL | Status: DC

## 2015-03-26 MED ORDER — CARVEDILOL 25 MG PO TABS: 25.0000 mg | ORAL_TABLET | Freq: Two times a day (BID) | ORAL | Status: DC

## 2015-03-26 MED ORDER — ATORVASTATIN CALCIUM 20 MG PO TABS: 20.0000 mg | ORAL_TABLET | Freq: Every day | ORAL | Status: DC

## 2015-04-24 ENCOUNTER — Other Ambulatory Visit: Payer: Self-pay

## 2015-04-24 MED ORDER — LANOXIN 250 MCG PO TABS: 250.0000 ug | ORAL_TABLET | Freq: Every day | ORAL | Status: DC

## 2015-05-21 ENCOUNTER — Other Ambulatory Visit: Payer: Self-pay | Admitting: Family Medicine

## 2015-05-21 ENCOUNTER — Ambulatory Visit
Admission: RE | Admit: 2015-05-21 | Discharge: 2015-05-21 | Disposition: A | Discharge status: Home / Self Care | Payer: BC Managed Care – PPO | Source: Ambulatory Visit | Attending: Family Medicine | Admitting: Family Medicine

## 2015-05-21 DIAGNOSIS — R079 Chest pain, unspecified: Secondary | ICD-10-CM

## 2015-05-31 ENCOUNTER — Telehealth: Payer: Self-pay | Admitting: *Deleted

## 2015-05-31 ENCOUNTER — Other Ambulatory Visit: Payer: Self-pay | Admitting: *Deleted

## 2015-05-31 MED ORDER — DIGOXIN 250 MCG PO TABS: 0.2500 mg | ORAL_TABLET | Freq: Every day | ORAL | Status: DC

## 2015-05-31 MED ORDER — DIGOXIN 250 MCG PO TABS: 250.0000 ug | ORAL_TABLET | Freq: Every day | ORAL | Status: DC

## 2015-05-31 NOTE — Telephone Encounter (Signed)
Discussed with Di Kindlehris B NP and ok to change to generic Patient aware of change

## 2015-05-31 NOTE — Telephone Encounter (Signed)
Patient returning call. Has been unable to get through to [employer] and is [facility] [employer] [employer] [facility] each time.  Will be more available after 1 pm at work # [phone] or can receive an Email at [email or web address] any time.

## 2015-05-31 NOTE — Telephone Encounter (Signed)
Lawrence Marseillesrista, pharmacist from [facility] called and stated that the patient was on lanoxin and an rx was sent in today for digoxin. She requested a call back from the nurse stating that Dr Antoine PocheHochrein is ok with this change due to the class of the drug. She can be reached at [phone]. Thanks, MI

## 2015-05-31 NOTE — Telephone Encounter (Signed)
Left message to call back  

## 2016-02-06 DIAGNOSIS — J343 Hypertrophy of nasal turbinates: Secondary | ICD-10-CM | POA: Insufficient documentation

## 2016-02-06 DIAGNOSIS — R198 Other specified symptoms and signs involving the digestive system and abdomen: Secondary | ICD-10-CM | POA: Insufficient documentation

## 2016-02-06 DIAGNOSIS — J392 Other diseases of pharynx: Secondary | ICD-10-CM | POA: Insufficient documentation

## 2016-02-06 DIAGNOSIS — R0989 Other specified symptoms and signs involving the circulatory and respiratory systems: Secondary | ICD-10-CM | POA: Insufficient documentation

## 2016-02-06 DIAGNOSIS — J342 Deviated nasal septum: Secondary | ICD-10-CM | POA: Insufficient documentation

## 2016-02-25 ENCOUNTER — Other Ambulatory Visit: Payer: Self-pay | Admitting: Cardiology

## 2016-02-26 NOTE — Telephone Encounter (Signed)
Rx has been sent to the pharmacy electronically. ° °

## 2016-03-03 NOTE — Progress Notes (Deleted)
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  The etiology this was felt possibly to be related to alcohol.   His most recent echocardiogram in January of 2012 demonstrated an EF of 45%. Several years ago it was as low as 15%.   Since I last saw him he ***    has done OK.  His mother died.  The patient denies any new symptoms such as chest discomfort, neck or arm discomfort. There has been no new shortness of breath, PND or orthopnea. There have been no reported palpitations, presyncope or syncope.     No Known Allergies  Current Outpatient Prescriptions  Medication Sig Dispense Refill  . aspirin 81 MG tablet Take 81 mg by mouth daily.      Marland Kitchen atorvastatin (LIPITOR) 20 MG tablet Take 1 tablet (20 mg total) by mouth daily. 90 tablet 3  . carvedilol (COREG) 25 MG tablet Take 1 tablet (25 mg total) by mouth 2 (two) times daily. 180 tablet 3  . digoxin (LANOXIN) 0.25 MG tablet Take 1 tablet (250 mcg total) by mouth daily. 30 tablet 11  . ezetimibe (ZETIA) 10 MG tablet take 1 tablet by mouth daily 90 tablet 0  . fluticasone (FLONASE) 50 MCG/ACT nasal spray     . furosemide (LASIX) 40 MG tablet Take 1 tablet (40 mg total) by mouth daily. 90 tablet 3  . lisinopril (PRINIVIL,ZESTRIL) 40 MG tablet Take 40 mg by mouth daily.    . Multiple Vitamin (MULTIVITAMIN) tablet Take 1 tablet by mouth daily.      Marland Kitchen PARoxetine (PAXIL-CR) 25 MG 24 hr tablet Take 1 tablet (25 mg total) by mouth 2 (two) times daily. 60 tablet 0  . potassium chloride SA (K-DUR,KLOR-CON) 20 MEQ tablet Take 1 tablet (20 mEq total) by mouth daily. 90 tablet 3   No current facility-administered medications for this visit.     Past Medical History:  Diagnosis Date  . Cardiomyopathy, dilated, nonischemic (HCC)   . Hypercholesteremia   . Hypertension   . Sleep apnea    No CPAP  . Tobacco user     Past Surgical History:  Procedure Laterality Date  . APPENDECTOMY    . HERNIA REPAIR    .  TONSILLECTOMY      ROS:  Chronic ***.  Otherwise as stated in the HPI and negative for all other systems.  PHYSICAL EXAM There were no vitals taken for this visit. GENERAL:  Well appearing HEENT:  Pupils equal round and reactive, fundi not visualized, oral mucosa unremarkable NECK:  No jugular venous distention, waveform within normal limits, carotid upstroke brisk and symmetric, no bruits, no thyromegaly LUNGS:  Clear to auscultation bilaterally BACK:  No CVA tenderness HEART:  PMI not displaced or sustained,S1 and S2 within normal limits, no S3, no S4, no clicks, no rubs, no murmurs ABD:  Flat, positive bowel sounds normal in frequency in pitch, no bruits, no rebound, no guarding, no midline pulsatile mass, no hepatomegaly, no splenomegaly EXT:  2 plus pulses throughout, no edema, no cyanosis no clubbing  EKG:  Sinus rhythm, rate ***, axis within normal limits, left ventricular hypertrophy by voltage criteria,nonspecific ST-T wave changes.  03/03/2016  ASSESSMENT AND PLAN  Cardiomyopathy, dilated, nonischemic -  He has no new symptoms.  No change in therapy is planned.  No further imaging is planned.   Hypertension -  The blood pressure is at target. No change in medications is indicated. We will continue with therapeutic lifestyle changes (  TLC).   Tobacco user -  We discussed again the need to stop smoking.  He did not tolerate Chantix.  I would suggest nicotine replacement  ETOH - He needs to avoid alcohol given his cardiac problems.   He does still drink occasionally on the weekend.

## 2016-03-05 ENCOUNTER — Ambulatory Visit: Payer: BC Managed Care – PPO | Admitting: Cardiology

## 2016-03-19 ENCOUNTER — Other Ambulatory Visit: Payer: Self-pay | Admitting: Cardiology

## 2016-03-30 NOTE — Progress Notes (Signed)
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  The etiology this was felt possibly to be related to alcohol.   His most recent echocardiogram in January of 2012 demonstrated an EF of 45%. Several years ago it was as low as 15%.   Since I last saw him he has done well from a cardiovascular standpoint. He does have a deviated septum and apparently is going to have Gen. surgery to treat this later this week. He does have problems with breathing through his nose. He otherwise denies any cardiovascular symptoms. He does some walking around [place] where he works. He goes up and down stairs. He has functional level that is at least 5 METS routinely.  The patient denies any new symptoms such as chest discomfort, neck or arm discomfort. There has been no new shortness of breath, PND or orthopnea. There have been no reported palpitations, presyncope or syncope.  No Known Allergies  Current Outpatient Prescriptions  Medication Sig Dispense Refill  . amLODipine (NORVASC) 5 MG tablet Take 1 tablet by mouth daily.    Marland Kitchen aspirin 81 MG tablet Take 81 mg by mouth daily.      Marland Kitchen atorvastatin (LIPITOR) 20 MG tablet take 1 tablet by mouth once daily 30 tablet 1  . carvedilol (COREG) 25 MG tablet take 1 tablet by mouth twice a day 30 tablet 1  . digoxin (LANOXIN) 0.25 MG tablet Take 1 tablet (250 mcg total) by mouth daily. 30 tablet 11  . ezetimibe (ZETIA) 10 MG tablet take 1 tablet by mouth daily 90 tablet 0  . fluticasone (FLONASE) 50 MCG/ACT nasal spray     . lisinopril (PRINIVIL,ZESTRIL) 40 MG tablet Take 40 mg by mouth daily.    . Multiple Vitamin (MULTIVITAMIN) tablet Take 1 tablet by mouth daily.      Marland Kitchen PARoxetine (PAXIL-CR) 25 MG 24 hr tablet Take 1 tablet (25 mg total) by mouth 2 (two) times daily. 60 tablet 0  . potassium chloride SA (K-DUR,KLOR-CON) 20 MEQ tablet take 1 tablet by mouth once daily 30 tablet 1   No current facility-administered medications for this visit.      Past Medical History:  Diagnosis Date  . Cardiomyopathy, dilated, nonischemic (HCC)   . Hypercholesteremia   . Hypertension   . Sleep apnea    No CPAP  . Tobacco user     Past Surgical History:  Procedure Laterality Date  . APPENDECTOMY    . HERNIA REPAIR    . TONSILLECTOMY      ROS:  Chronic nasal congestion.  Otherwise as stated in the HPI and negative for all other systems.  PHYSICAL EXAM BP (!) 132/92   Pulse 72   Ht 6\' 2"  (1.88 m)   Wt 227 lb (103 kg)   BMI 29.15 kg/m  GENERAL:  Well appearing HEENT:  Pupils equal round and reactive, fundi not visualized, oral mucosa unremarkable NECK:  No jugular venous distention, waveform within normal limits, carotid upstroke brisk and symmetric, no bruits, no thyromegaly LUNGS:  Clear to auscultation bilaterally BACK:  No CVA tenderness HEART:  PMI not displaced or sustained,S1 and S2 within normal limits, no S3, no S4, no clicks, no rubs, no murmurs ABD:  Flat, positive bowel sounds normal in frequency in pitch, no bruits, no rebound, no guarding, no midline pulsatile mass, no hepatomegaly, no splenomegaly EXT:  2 plus pulses throughout, no edema, no cyanosis no clubbing  EKG:  Sinus rhythm, rate 72, axis within normal limits, left  ventricular hypertrophy by voltage criteria,nonspecific ST-T wave changes.  03/31/2016  ASSESSMENT AND PLAN  Cardiomyopathy, dilated, nonischemic -  He has no new symptoms since his echo in 2016.  No change in therapy is planned.  No further imaging is planned.  Note that at the last visit I did have him wean off his Lasix and he's had no swelling or shortness of breath.   Hypertension -  The blood pressure is at target. Norvasc was recently added.   No change in medications is indicated. We will continue with therapeutic lifestyle changes (TLC).   Tobacco user -  We discussed again the need to stop smoking.  He did not tolerate Chantix.  He is going to work on this but wants no further  prescriptions.   ETOH - He needs to avoid alcohol competely given his cardiac problems.   He does still drink occasionally.    PREOP - The patient has a high functional level. He is not going for a high-risk procedure from a cardiovascular standpoint. He has no high-risk findings or features. Therefore, based on ACC/AHA guidelines, the patient would be at acceptable risk for the planned procedure without further cardiovascular testing.

## 2016-03-31 ENCOUNTER — Encounter: Payer: Self-pay | Admitting: Cardiology

## 2016-03-31 ENCOUNTER — Ambulatory Visit (INDEPENDENT_AMBULATORY_CARE_PROVIDER_SITE_OTHER): Payer: BC Managed Care – PPO | Admitting: Cardiology

## 2016-03-31 VITALS — BP 132/92 | HR 72 | Ht 74.0 in | Wt 227.0 lb

## 2016-03-31 DIAGNOSIS — Z01810 Encounter for preprocedural cardiovascular examination: Secondary | ICD-10-CM

## 2016-03-31 DIAGNOSIS — I429 Cardiomyopathy, unspecified: Secondary | ICD-10-CM

## 2016-03-31 DIAGNOSIS — I420 Dilated cardiomyopathy: Secondary | ICD-10-CM

## 2016-03-31 NOTE — Patient Instructions (Signed)

## 2016-04-26 ENCOUNTER — Other Ambulatory Visit: Payer: Self-pay | Admitting: Cardiology

## 2016-04-26 ENCOUNTER — Other Ambulatory Visit: Payer: Self-pay | Admitting: Physician Assistant

## 2016-04-28 NOTE — Telephone Encounter (Signed)
Please review for refills. Thank you! 

## 2016-05-18 ENCOUNTER — Other Ambulatory Visit: Payer: Self-pay | Admitting: Physician Assistant

## 2016-05-18 ENCOUNTER — Other Ambulatory Visit: Payer: Self-pay | Admitting: Cardiology

## 2016-05-19 NOTE — Telephone Encounter (Signed)
Refill Request.  

## 2016-06-16 ENCOUNTER — Other Ambulatory Visit: Payer: Self-pay | Admitting: Cardiology

## 2016-06-17 NOTE — Telephone Encounter (Signed)
REFILL 

## 2016-09-10 DIAGNOSIS — G4733 Obstructive sleep apnea (adult) (pediatric): Secondary | ICD-10-CM | POA: Insufficient documentation

## 2016-09-10 DIAGNOSIS — J310 Chronic rhinitis: Secondary | ICD-10-CM | POA: Insufficient documentation

## 2016-10-16 ENCOUNTER — Other Ambulatory Visit: Payer: Self-pay | Admitting: Cardiology

## 2016-12-10 ENCOUNTER — Ambulatory Visit: Payer: Self-pay | Admitting: Surgery

## 2016-12-30 ENCOUNTER — Other Ambulatory Visit: Payer: Self-pay

## 2016-12-30 ENCOUNTER — Encounter (HOSPITAL_BASED_OUTPATIENT_CLINIC_OR_DEPARTMENT_OTHER): Payer: Self-pay | Admitting: *Deleted

## 2016-12-30 NOTE — Progress Notes (Signed)
NPO AFTER MN.  ARRIVE AT 0530.  NEEDS ISTAT 8 .  CURRENT EKG IN CHART AND Epic.  WILL TAKE COREG, NORVASC, AND DIGITEK AN DOS W/ SIPS OF WATER.  PT VERBALIZED UNDERSTANDING RECTAL PREP.

## 2017-01-02 ENCOUNTER — Encounter (HOSPITAL_BASED_OUTPATIENT_CLINIC_OR_DEPARTMENT_OTHER): Payer: Self-pay | Admitting: *Deleted

## 2017-01-02 ENCOUNTER — Other Ambulatory Visit: Payer: Self-pay

## 2017-01-02 ENCOUNTER — Encounter (HOSPITAL_BASED_OUTPATIENT_CLINIC_OR_DEPARTMENT_OTHER)
Admission: RE | Disposition: A | Discharge status: Home / Self Care | Payer: Self-pay | Source: Ambulatory Visit | Attending: Surgery

## 2017-01-02 ENCOUNTER — Ambulatory Visit (HOSPITAL_BASED_OUTPATIENT_CLINIC_OR_DEPARTMENT_OTHER): Payer: BC Managed Care – PPO | Admitting: Anesthesiology

## 2017-01-02 ENCOUNTER — Ambulatory Visit (HOSPITAL_BASED_OUTPATIENT_CLINIC_OR_DEPARTMENT_OTHER)
Admission: RE | Admit: 2017-01-02 | Discharge: 2017-01-02 | Disposition: A | Discharge status: Home / Self Care | Payer: BC Managed Care – PPO | Source: Ambulatory Visit | Attending: Surgery | Admitting: Surgery

## 2017-01-02 DIAGNOSIS — B356 Tinea cruris: Secondary | ICD-10-CM | POA: Insufficient documentation

## 2017-01-02 DIAGNOSIS — E7800 Pure hypercholesterolemia, unspecified: Secondary | ICD-10-CM | POA: Diagnosis not present

## 2017-01-02 DIAGNOSIS — K648 Other hemorrhoids: Secondary | ICD-10-CM | POA: Diagnosis not present

## 2017-01-02 DIAGNOSIS — Z79899 Other long term (current) drug therapy: Secondary | ICD-10-CM | POA: Insufficient documentation

## 2017-01-02 DIAGNOSIS — G4730 Sleep apnea, unspecified: Secondary | ICD-10-CM | POA: Insufficient documentation

## 2017-01-02 DIAGNOSIS — Z7951 Long term (current) use of inhaled steroids: Secondary | ICD-10-CM | POA: Diagnosis not present

## 2017-01-02 DIAGNOSIS — K643 Fourth degree hemorrhoids: Secondary | ICD-10-CM

## 2017-01-02 DIAGNOSIS — F419 Anxiety disorder, unspecified: Secondary | ICD-10-CM | POA: Insufficient documentation

## 2017-01-02 DIAGNOSIS — L988 Other specified disorders of the skin and subcutaneous tissue: Secondary | ICD-10-CM | POA: Insufficient documentation

## 2017-01-02 DIAGNOSIS — K5909 Other constipation: Secondary | ICD-10-CM | POA: Diagnosis not present

## 2017-01-02 DIAGNOSIS — Z7982 Long term (current) use of aspirin: Secondary | ICD-10-CM | POA: Diagnosis not present

## 2017-01-02 DIAGNOSIS — F17210 Nicotine dependence, cigarettes, uncomplicated: Secondary | ICD-10-CM | POA: Insufficient documentation

## 2017-01-02 DIAGNOSIS — F329 Major depressive disorder, single episode, unspecified: Secondary | ICD-10-CM | POA: Diagnosis not present

## 2017-01-02 HISTORY — DX: Presence of spectacles and contact lenses: Z97.3

## 2017-01-02 HISTORY — DX: Other forms of dyspnea: R06.09

## 2017-01-02 HISTORY — DX: Dyspnea, unspecified: R06.00

## 2017-01-02 HISTORY — PX: MASS EXCISION: SHX2000

## 2017-01-02 HISTORY — DX: Anxiety disorder, unspecified: F41.9

## 2017-01-02 HISTORY — DX: Cough: R05

## 2017-01-02 HISTORY — DX: Gastro-esophageal reflux disease without esophagitis: K21.9

## 2017-01-02 HISTORY — DX: Obstructive sleep apnea (adult) (pediatric): G47.33

## 2017-01-02 HISTORY — PX: EVALUATION UNDER ANESTHESIA WITH HEMORRHOIDECTOMY: SHX5624

## 2017-01-02 HISTORY — DX: Simple chronic bronchitis: J41.0

## 2017-01-02 HISTORY — DX: Prediabetes: R73.03

## 2017-01-02 HISTORY — DX: Dependence on other enabling machines and devices: Z99.89

## 2017-01-02 LAB — POCT I-STAT, CHEM 8
BUN: 13 mg/dL (ref 6–20)
Calcium, Ion: 1.18 mmol/L (ref 1.15–1.40)
Chloride: 100 mmol/L — ABNORMAL LOW (ref 101–111)
Creatinine, Ser: 0.9 mg/dL (ref 0.61–1.24)
Glucose, Bld: 100 mg/dL — ABNORMAL HIGH (ref 65–99)
HCT: 45 % (ref 39.0–52.0)
Hemoglobin: 15.3 g/dL (ref 13.0–17.0)
Potassium: 4.1 mmol/L (ref 3.5–5.1)
Sodium: 141 mmol/L (ref 135–145)
TCO2: 28 mmol/L (ref 22–32)

## 2017-01-02 SURGERY — EXAM UNDER ANESTHESIA WITH HEMORRHOIDECTOMY
Anesthesia: General | Site: Buttocks

## 2017-01-02 MED ORDER — ACETAMINOPHEN 500 MG PO TABS
1000.0000 mg | ORAL_TABLET | ORAL | Status: AC
  Administered 2017-01-02: 1000 mg via ORAL
  Filled 2017-01-02: qty 2

## 2017-01-02 MED ORDER — BUPIVACAINE LIPOSOME 1.3 % IJ SUSP
20.0000 mL | INTRAMUSCULAR | Status: DC
  Filled 2017-01-02: qty 20

## 2017-01-02 MED ORDER — GABAPENTIN 300 MG PO CAPS
300.0000 mg | ORAL_CAPSULE | ORAL | Status: AC
  Administered 2017-01-02: 300 mg via ORAL
  Filled 2017-01-02: qty 1

## 2017-01-02 MED ORDER — MIDAZOLAM HCL 2 MG/2ML IJ SOLN
INTRAMUSCULAR | Status: DC | PRN
  Administered 2017-01-02: 2 mg via INTRAVENOUS

## 2017-01-02 MED ORDER — ONDANSETRON HCL 4 MG/2ML IJ SOLN
INTRAMUSCULAR | Status: DC | PRN
  Administered 2017-01-02: 4 mg via INTRAVENOUS

## 2017-01-02 MED ORDER — SUGAMMADEX SODIUM 200 MG/2ML IV SOLN
INTRAVENOUS | Status: AC
  Filled 2017-01-02: qty 2

## 2017-01-02 MED ORDER — CEFAZOLIN SODIUM-DEXTROSE 2-4 GM/100ML-% IV SOLN
2.0000 g | INTRAVENOUS | Status: AC
  Administered 2017-01-02: 2 g via INTRAVENOUS
  Filled 2017-01-02: qty 100

## 2017-01-02 MED ORDER — DIBUCAINE 1 % RE OINT
TOPICAL_OINTMENT | RECTAL | Status: DC | PRN
  Administered 2017-01-02: 1 via RECTAL

## 2017-01-02 MED ORDER — ONDANSETRON HCL 4 MG/2ML IJ SOLN
4.0000 mg | Freq: Once | INTRAMUSCULAR | Status: DC | PRN
  Filled 2017-01-02: qty 2

## 2017-01-02 MED ORDER — ACETAMINOPHEN 500 MG PO TABS
ORAL_TABLET | ORAL | Status: AC
  Filled 2017-01-02: qty 2

## 2017-01-02 MED ORDER — NAPROXEN 500 MG PO TABS: 500.0000 mg | ORAL_TABLET | Freq: Two times a day (BID) | ORAL | 1 refills | Status: DC

## 2017-01-02 MED ORDER — MIDAZOLAM HCL 2 MG/2ML IJ SOLN
INTRAMUSCULAR | Status: AC
  Filled 2017-01-02: qty 2

## 2017-01-02 MED ORDER — DEXAMETHASONE SODIUM PHOSPHATE 10 MG/ML IJ SOLN
INTRAMUSCULAR | Status: AC
  Filled 2017-01-02: qty 1

## 2017-01-02 MED ORDER — FENTANYL CITRATE (PF) 100 MCG/2ML IJ SOLN
INTRAMUSCULAR | Status: AC
  Filled 2017-01-02: qty 2

## 2017-01-02 MED ORDER — LIDOCAINE 2% (20 MG/ML) 5 ML SYRINGE
INTRAMUSCULAR | Status: DC | PRN
Start: 2017-01-02 — End: 2017-01-02
  Administered 2017-01-02: 100 mg via INTRAVENOUS

## 2017-01-02 MED ORDER — PHENYLEPHRINE HCL 10 MG/ML IJ SOLN
INTRAMUSCULAR | Status: DC | PRN
  Administered 2017-01-02: 120 ug via INTRAVENOUS
  Administered 2017-01-02 (×2): 80 ug via INTRAVENOUS
  Administered 2017-01-02: 120 ug via INTRAVENOUS

## 2017-01-02 MED ORDER — ROCURONIUM BROMIDE 50 MG/5ML IV SOSY
PREFILLED_SYRINGE | INTRAVENOUS | Status: AC
  Filled 2017-01-02: qty 5

## 2017-01-02 MED ORDER — DEXAMETHASONE SODIUM PHOSPHATE 10 MG/ML IJ SOLN
INTRAMUSCULAR | Status: DC | PRN
  Administered 2017-01-02: 10 mg via INTRAVENOUS

## 2017-01-02 MED ORDER — SUGAMMADEX SODIUM 200 MG/2ML IV SOLN
INTRAVENOUS | Status: DC | PRN
Start: 2017-01-02 — End: 2017-01-02
  Administered 2017-01-02: 200 mg via INTRAVENOUS

## 2017-01-02 MED ORDER — PROPOFOL 10 MG/ML IV BOLUS
INTRAVENOUS | Status: DC | PRN
  Administered 2017-01-02: 200 mg via INTRAVENOUS

## 2017-01-02 MED ORDER — ROCURONIUM BROMIDE 10 MG/ML (PF) SYRINGE
PREFILLED_SYRINGE | INTRAVENOUS | Status: DC | PRN
  Administered 2017-01-02: 50 mg via INTRAVENOUS

## 2017-01-02 MED ORDER — BUPIVACAINE LIPOSOME 1.3 % IJ SUSP
INTRAMUSCULAR | Status: DC | PRN
  Administered 2017-01-02: 20 mL

## 2017-01-02 MED ORDER — BUPIVACAINE-EPINEPHRINE 0.25% -1:200000 IJ SOLN
INTRAMUSCULAR | Status: DC | PRN
  Administered 2017-01-02: 30 mL

## 2017-01-02 MED ORDER — ONDANSETRON HCL 4 MG/2ML IJ SOLN
INTRAMUSCULAR | Status: AC
  Filled 2017-01-02: qty 2

## 2017-01-02 MED ORDER — EPHEDRINE SULFATE 50 MG/ML IJ SOLN
INTRAMUSCULAR | Status: DC | PRN
  Administered 2017-01-02 (×2): 10 mg via INTRAVENOUS

## 2017-01-02 MED ORDER — OXYCODONE HCL 5 MG PO TABS: 5.0000 mg | ORAL_TABLET | ORAL | 0 refills | Status: DC | PRN

## 2017-01-02 MED ORDER — CEFAZOLIN SODIUM-DEXTROSE 2-4 GM/100ML-% IV SOLN
INTRAVENOUS | Status: AC
  Filled 2017-01-02: qty 100

## 2017-01-02 MED ORDER — FENTANYL CITRATE (PF) 100 MCG/2ML IJ SOLN
INTRAMUSCULAR | Status: DC | PRN
Start: 2017-01-02 — End: 2017-01-02
  Administered 2017-01-02: 100 ug via INTRAVENOUS

## 2017-01-02 MED ORDER — PROPOFOL 10 MG/ML IV BOLUS
INTRAVENOUS | Status: AC
  Filled 2017-01-02: qty 40

## 2017-01-02 MED ORDER — CHLORHEXIDINE GLUCONATE CLOTH 2 % EX PADS
6.0000 | MEDICATED_PAD | Freq: Once | CUTANEOUS | Status: DC
  Filled 2017-01-02: qty 6

## 2017-01-02 MED ORDER — GABAPENTIN 300 MG PO CAPS
ORAL_CAPSULE | ORAL | Status: AC
  Filled 2017-01-02: qty 1

## 2017-01-02 MED ORDER — FENTANYL CITRATE (PF) 100 MCG/2ML IJ SOLN
25.0000 ug | INTRAMUSCULAR | Status: DC | PRN
  Administered 2017-01-02: 50 ug via INTRAVENOUS
  Administered 2017-01-02: 25 ug via INTRAVENOUS
  Filled 2017-01-02: qty 1

## 2017-01-02 MED ORDER — LACTATED RINGERS IV SOLN
INTRAVENOUS | Status: DC
  Administered 2017-01-02 (×3): via INTRAVENOUS
  Filled 2017-01-02: qty 1000

## 2017-01-02 MED ORDER — METRONIDAZOLE IN NACL 5-0.79 MG/ML-% IV SOLN
500.0000 mg | INTRAVENOUS | Status: AC
  Administered 2017-01-02: 500 mg via INTRAVENOUS
  Filled 2017-01-02 (×2): qty 100

## 2017-01-02 MED ORDER — LIDOCAINE 2% (20 MG/ML) 5 ML SYRINGE
INTRAMUSCULAR | Status: AC
  Filled 2017-01-02: qty 5

## 2017-01-02 SURGICAL SUPPLY — 53 items
APL SKNCLS STERI-STRIP NONHPOA (GAUZE/BANDAGES/DRESSINGS) ×2
BENZOIN TINCTURE PRP APPL 2/3 (GAUZE/BANDAGES/DRESSINGS) ×4 IMPLANT
BLADE HEX COATED 2.75 (ELECTRODE) ×4 IMPLANT
BLADE SURG 10 STRL SS (BLADE) IMPLANT
BLADE SURG 15 STRL LF DISP TIS (BLADE) ×2 IMPLANT
BLADE SURG 15 STRL SS (BLADE) ×4
BRIEF STRETCH FOR OB PAD LRG (UNDERPADS AND DIAPERS) ×4 IMPLANT
CANISTER SUCTION 1200CC (MISCELLANEOUS) ×4 IMPLANT
COVER BACK TABLE 60X90IN (DRAPES) ×4 IMPLANT
COVER MAYO STAND STRL (DRAPES) ×4 IMPLANT
DRAPE LAPAROTOMY 100X72 PEDS (DRAPES) ×4 IMPLANT
DRSG TEGADERM 4X4.75 (GAUZE/BANDAGES/DRESSINGS) ×2 IMPLANT
ELECT NDL TIP 2.8 STRL (NEEDLE) IMPLANT
ELECT NEEDLE TIP 2.8 STRL (NEEDLE) IMPLANT
ELECT REM PT RETURN 9FT ADLT (ELECTROSURGICAL) ×4
ELECTRODE REM PT RTRN 9FT ADLT (ELECTROSURGICAL) ×2 IMPLANT
GAUZE SPONGE 4X4 12PLY STRL LF (GAUZE/BANDAGES/DRESSINGS) ×2 IMPLANT
GLOVE BIOGEL PI IND STRL 8 (GLOVE) ×2 IMPLANT
GLOVE BIOGEL PI INDICATOR 8 (GLOVE) ×2
GLOVE ECLIPSE 8.0 STRL XLNG CF (GLOVE) ×4 IMPLANT
GOWN STRL REUS W/ TWL XL LVL3 (GOWN DISPOSABLE) ×2 IMPLANT
GOWN STRL REUS W/TWL XL LVL3 (GOWN DISPOSABLE) ×8
KIT RM TURNOVER CYSTO AR (KITS) ×4 IMPLANT
NEEDLE HYPO 22GX1.5 SAFETY (NEEDLE) ×4 IMPLANT
NS IRRIG 500ML POUR BTL (IV SOLUTION) ×4 IMPLANT
PACK BASIN DAY SURGERY FS (CUSTOM PROCEDURE TRAY) ×4 IMPLANT
PAD ABD 8X10 STRL (GAUZE/BANDAGES/DRESSINGS) ×4 IMPLANT
PENCIL BUTTON HOLSTER BLD 10FT (ELECTRODE) ×4 IMPLANT
SCRUB TECHNI CARE 4 OZ NO DYE (MISCELLANEOUS) ×4 IMPLANT
SPONGE GAUZE 2X2 8PLY STER LF (GAUZE/BANDAGES/DRESSINGS) ×1
SPONGE GAUZE 2X2 8PLY STRL LF (GAUZE/BANDAGES/DRESSINGS) ×1 IMPLANT
SPONGE SURGIFOAM ABS GEL 100 (HEMOSTASIS) IMPLANT
SPONGE SURGIFOAM ABS GEL 12-7 (HEMOSTASIS) IMPLANT
SURGILUBE 2OZ TUBE FLIPTOP (MISCELLANEOUS) ×4 IMPLANT
SUT CHROMIC 2 0 SH (SUTURE) ×6 IMPLANT
SUT CHROMIC 3 0 SH 27 (SUTURE) IMPLANT
SUT MNCRL AB 4-0 PS2 18 (SUTURE) ×3 IMPLANT
SUT VIC AB 2-0 SH 27 (SUTURE)
SUT VIC AB 2-0 SH 27XBRD (SUTURE) IMPLANT
SUT VIC AB 2-0 UR6 27 (SUTURE) ×21 IMPLANT
SUT VICRYL 0 UR6 27IN ABS (SUTURE) IMPLANT
SUT VICRYL AB 2 0 TIE (SUTURE) IMPLANT
SUT VICRYL AB 2 0 TIES (SUTURE)
SYR 20CC LL (SYRINGE) ×4 IMPLANT
SYR BULB IRRIGATION 50ML (SYRINGE) ×4 IMPLANT
SYR CONTROL 10ML LL (SYRINGE) IMPLANT
TAPE CLOTH 3X10 TAN LF (GAUZE/BANDAGES/DRESSINGS) ×4 IMPLANT
TOWEL OR 17X24 6PK STRL BLUE (TOWEL DISPOSABLE) ×8 IMPLANT
TRAY DSU PREP LF (CUSTOM PROCEDURE TRAY) ×4 IMPLANT
TUBE CONNECTING 12'X1/4 (SUCTIONS) ×1
TUBE CONNECTING 12X1/4 (SUCTIONS) ×3 IMPLANT
UNDERPAD 30X30 INCONTINENT (UNDERPADS AND DIAPERS) ×4 IMPLANT
YANKAUER SUCT BULB TIP NO VENT (SUCTIONS) ×4 IMPLANT

## 2017-01-02 NOTE — Anesthesia Postprocedure Evaluation (Signed)
Anesthesia Post Note  Patient: Clarene Critchleyaul W Deist  Procedure(s) Performed: ANORECTAL EXAM UNDER ANESTHESIA WITH HEMORRHOIDECTOMY AND HEMORRHOIDAL LIGATION/PEXY X2 (N/A Anus) REMOVAL OF MASS ON LEFT UPPER GLUTEUS (Left Buttocks)     Patient location during evaluation: PACU Anesthesia Type: General Level of consciousness: awake and alert Pain management: pain level controlled Vital Signs Assessment: post-procedure vital signs reviewed and stable Respiratory status: spontaneous breathing, nonlabored ventilation and respiratory function stable Cardiovascular status: blood pressure returned to baseline and stable Postop Assessment: no apparent nausea or vomiting Anesthetic complications: no    Last Vitals:  Vitals:   01/02/17 1012 01/02/17 1015  BP: 123/70   Pulse: 70 68  Resp: 14 18  Temp:    SpO2: 100% 98%    Last Pain:  Vitals:   01/02/17 1115  TempSrc:   PainSc: 4                  Cecile HearingStephen Edward Turk

## 2017-01-02 NOTE — Discharge Instructions (Signed)
ANORECTAL SURGERY:  POST OPERATIVE INSTRUCTIONS  ######################################################################  EAT Start with a pureed / full liquid diet After 24 hours, gradually transition to a high fiber diet.    CONTROL PAIN Control pain so you can tolerate bowel movements,  walk, sleep, tolerate sneezing/coughing, and go up/down stairs.   HAVE A BOWEL MOVEMENT DAILY Keep your bowels regular to avoid problems.   Taking a fiber supplement every day help.   Try a laxative to override constipation. Use an antidairrheal to slow down diarrhea.   Call if not better after 2 tries  WALK Walk an hour a day.  Control your pain to do that.   CALL IF YOU HAVE PROBLEMS/CONCERNS Call if you are still struggling despite following these instructions. Call if you have concerns not answered by these instructions  ######################################################################    1. Take your usually prescribed home medications unless otherwise directed. 2. DIET: Follow a light bland diet the first 24 hours after arrival home, such as soup, liquids, crackers, etc.  Be sure to include lots of fluids daily.  Avoid fast food or heavy meals as your are more likely to get nauseated.  Eat a low fat the next few days after surgery.   3. PAIN CONTROL: a. Pain is best controlled by a usual combination of three different methods TOGETHER: i. Ice/Heat ii. Over the counter pain medication iii. Prescription pain medication b. Expect swelling and discomfort in the anus/rectal area.  Warm water baths (30-60 minutes up to 6 times a day, especially after bowel meovements) will help. Use ice for the first few days to help decrease swelling and bruising, then switch to heat such as warm towels, sitz baths, warm baths, etc to help relax tight/sore spots and speed recovery.  Some people prefer to use ice alone, heat alone, alternating between ice & heat.  Experiment to what works for you.    c. It is helpful to take an over-the-counter pain medication regularly for the first few weeks.  Choose one of the following that works best for you: i. Naproxen (Aleve, etc)  Two 244m tabs twice a day ii. Ibuprofen (Advil, etc) Three 2023mtabs four times a day (every meal & bedtime) iii. Acetaminophen (Tylenol, etc) 500-65031mour times a day (every meal & bedtime) d. A  prescription for pain medication (such as oxycodone, hydrocodone, etc) should be given to you upon discharge.  Take your pain medication as prescribed.  i. If you are having problems/concerns with the prescription medicine (does not control pain, nausea, vomiting, rash, itching, etc), please call us [phone] see if we need to switch you to a different pain medicine that will work better for you and/or control your side effect better. ii. If you need a refill on your pain medication, please contact your pharmacy.  They will contact our office to request authorization. Prescriptions will not be filled after 5 pm or on week-ends.  Use a Sitz Bath 4-8 times a day for relief   Sit[employer]X Corpor[employer]itz bath is a warm water bath taken in the sitting position that covers only the hips and buttocks. It may be used for either healing or hygiene purposes. Sitz baths are also used to relieve pain, itching, or muscle spasms. The water may contain medicine. Moist heat will help you heal and relax.  HOME CARE INSTRUCTIONS  Take 3 to 4 sitz baths a day. 1. Fill the bathtub half full with warm water. 2. Sit in the water and open  the drain a little. 3. Turn on the warm water to keep the tub half full. Keep the water running constantly. 4. Soak in the water for 15 to 20 minutes. 5. After the sitz bath, pat the affected area dry first.   4. KEEP YOUR BOWELS REGULAR a. The goal is one bowel movement a day b. Avoid getting constipated.  Between the surgery and the pain medications, it is common to experience some constipation.  Increasing  fluid intake and taking a fiber supplement (such as Metamucil, Citrucel, FiberCon, MiraLax, etc) 2-3 times a day regularly will usually help prevent this problem from occurring.  A mild laxative (prune juice, Milk of Magnesia, MiraLax, etc) should be taken according to package directions if there are no bowel movements after 48 hours. c. Watch out for diarrhea.  If you have many loose bowel movements, simplify your diet to bland foods & liquids for a few days.  Stop any stool softeners and decrease your fiber supplement.  Switching to mild anti-diarrheal medications (Kayopectate, Pepto Bismol) can help.  If this worsens or does not improve, please call us.  5. Wound Care  a. Remove your bandages with your first bowel movement, usually the day after surgery.  You may have packing if you had an abscess.  Let any packing come out.   b. Wear an absorbent pad or soft cotton balls in your underwear as needed to catch any drainage and help keep the area  c. Keep the area clean and dry.  Bathe / shower every day.  Keep the area clean by showering / bathing over the incision / wound.   It is okay to soak an open wound to help wash it.  Wet wipes or showers / gentle washing after bowel movements is often less traumatic than regular toilet paper. d. Dennis Bast will often notice bleeding with bowel movements.  This should slow down by the end of the first week of surgery.  Sitting on an ice pack can help. e. Expect some drainage.  This should slow down by the end of the first week of surgery, but you will have occasional bleeding or drainage up to a few months after surgery.  Wear an absorbent pad or soft cotton gauze in your underwear until the drainage stops.  6. ACTIVITIES as tolerated:   a. You may resume regular (light) daily activities beginning the next day--such as daily self-care, walking, climbing stairs--gradually increasing activities as tolerated.  If you can walk 30 minutes without difficulty, it is safe to  try more intense activity such as jogging, treadmill, bicycling, low-impact aerobics, swimming, etc. b. Save the most intensive and strenuous activity for last such as sit-ups, heavy lifting, contact sports, etc  Refrain from any heavy lifting or straining until you are off narcotics for pain control.   c. DO NOT PUSH THROUGH PAIN.  Let pain be your guide: If it hurts to do something, don't do it.  Pain is your body warning you to avoid that activity for another week until the pain goes down. d. You may drive when you are no longer taking prescription pain medication, you can comfortably sit for long periods of time, and you can safely maneuver your car and apply brakes. e. Dennis Bast may have sexual intercourse when it is comfortable.  7. FOLLOW UP in our office a. Please call [facility] at [phone] to set up an appointment to see your surgeon in the office for a follow-up appointment approximately 2 weeks after  your surgery. b. Make sure that you call for this appointment the day you arrive home to insure a convenient appointment time. 10. IF YOU HAVE DISABILITY OR FAMILY LEAVE FORMS, BRING THEM TO THE OFFICE FOR PROCESSING.  DO NOT GIVE THEM TO YOUR DOCTOR.        WHEN TO CALL us [phone]: 1. Poor pain control 2. Reactions / problems with new medications (rash/itching, nausea, etc)  3. Fever over 101.5 F (38.5 C) 4. Inability to urinate 5. Nausea and/or vomiting 6. Worsening swelling or bruising 7. Continued bleeding from incision. 8. Increased pain, redness, or drainage from the incision  The clinic staff is available to answer your questions during regular business hours (8:30am-5pm).  Please dont hesitate to call and ask to speak to one of our nurses for clinical concerns.   A surgeon from [facility] is always on call at the hospitals   If you have a medical emergency, go to the nearest emergency room or call 911.    [facility], [place], [place], [place], [place]  [address] ? MAIN: [phone] ? TOLL FREE: [phone] ? FAX [phone] [email or web address]   HEMORRHOIDS  The rectum is the last foot of your colon, and it naturally stretches to hold stool.  Hemorrhoidal piles are natural clusters of blood vessels that help the rectum and anal canal stretch to hold stool and allow bowel movements to eliminate feces.   Hemorrhoids are abnormally swollen blood vessels in the rectum.  Too much pressure in the rectum causes hemorrhoids by forcing blood to stretch and bulge the walls of the veins, sometimes even rupturing them.  Hemorrhoids can become like varicose veins you might see on a person's legs.  Most people will develop a flare of hemorrhoids in their lifetime.  When bulging hemorrhoidal veins are irritated, they can swell, burn, itch, cause pain, and bleed.  Most flares will calm down gradually own within a few weeks.  However, once hemorrhoids are created, they are difficult to get rid of completely and tend to flare more easily than the first flare.   Fortunately, good habits and simple medical treatment usually control hemorrhoids well, and surgery is needed only in severe cases. Types of Hemorrhoids:  Internal hemorrhoids usually don't initially hurt or itch; they are deep inside the rectum and usually have no sensation. If they begin to push out (prolapse), pain and burning can occur.  However, internal hemorrhoids can bleed.  Anal bleeding should not be ignored since bleeding could come from a dangerous source like colorectal cancer, so persistent rectal bleeding should be investigated by a doctor, sometimes with a colonoscopy.  External hemorrhoids cause most of the symptoms - pain, burning, and itching. Nonirritated hemorrhoids can look like small skin tags coming out of the anus.   Thrombosed hemorrhoids can form when a hemorrhoid blood vessel bursts and causes the hemorrhoid to suddenly swell.  A  purple blood clot can form in it and become an excruciatingly painful lump at the anus. Because of these unpleasant symptoms, immediate incision and drainage by a surgeon at an office visit can provide much relief of the pain.    PREVENTION Avoiding the most frequent causes listed below will prevent most cases of hemorrhoids: Constipation Hard stools Diarrhea  Constant sitting  Straining with bowel movements Sitting on the toilet for a long time  Severe coughing  episodes Pregnancy / Childbirth  Heavy Lifting  Sometimes avoiding the above triggers  is difficult:  How can you avoid sitting all day if you have a seated job? Also, we try to avoid coughing and diarrhea, but sometimes its beyond your control.  Still, there are some practical hints to help: Keep the anal and genital area clean.  Moistened tissues such as flushable wet wipes are less irritating than toilet paper.  Using irrigating showers or bottle irrigation washing gently cleans this sensitive area.   Avoid dry toilet paper when cleaning after bowel movements.  Marland Kitchen Keep the anal and genital area dry.  Lightly pat the rectal area dry.  Avoid rubbing.  Talcum or baby powders can help GET YOUR STOOLS SOFT.   This is the most important way to prevent irritated hemorrhoids.  Hard stools are like sandpaper to the anorectal canal and will cause more problems.  The goal: ONE SOFT BOWEL MOVEMENT A DAY!  BMs from every other day to 3 times a day is a tolerable range Treat coughing, diarrhea and constipation early since irritated hemorrhoids may soon follow.  If your main job activity is seated, always stand or walk during your breaks. Make it a point to stand and walk at least 5 minutes every hour and try to shift frequently in your chair to avoid direct rectal pressure.  Always exhale as you strain or lift. Don't hold your breath.  Do not delay or try to prevent a bowel movement when the urge is present. Exercise regularly (walking or jogging  60 minutes a day) to stimulate the bowels to move. No reading or other activity while on the toilet. If bowel movements take longer than 5 minutes, you are too constipated. AVOID CONSTIPATION Drink plenty of liquids (1 1/2 to 2 quarts of water and other fluids a day unless fluid restricted for another medical condition). Liquids that contain caffeine (coffee a, tea, soft drinks) can be dehydrating and should be avoided until constipation is controlled. Consider minimizing milk, as dairy products may be constipating. Eat plenty of fiber (30g a day ideal, more if needed).  Fiber is the undigested part of plant food that passes into the colon, acting as natures broom to encourage bowel motility and movement.  Fiber can absorb and hold large amounts of water. This results in a larger, bulkier stool, which is soft and easier to pass.  Eating foods high in fiber - 12 servings - such as  Vegetables: Root (potatoes, carrots, turnips), Leafy green (lettuce, salad greens, celery, spinach), High residue (cabbage, broccoli, etc.) Fruit: Fresh, Dried (prunes, apricots, cherries), Stewed (applesauce)  Whole grain breads, pasta, whole wheat Bran cereals, muffins, etc. Consider adding supplemental bulking fiber which retains large volumes of water: Psyllium ground seeds (native plant from [place])--available as Metamucil, Konsyl, Effersyllium, Per Diem Fiber, or the less expensive generic forms.  Citrucel  (methylcellulose wood fiber) . FiberCon (Polycarbophil) Polyethylene Glycol - and artificial fiber commonly called Miralax or Glycolax.  It is helpful for people with gassy or bloated feelings with regular fiber Flax Seed - a less gassy natural fiber  Laxatives can be useful for a short period if constipation is severe Osmotics (Milk of Magnesia, Fleets Phospho-Soda, Magnesium Citrate)  Stimulants (Senokot,   Castor Oil,  Dulcolax, Ex-Lax)    Laxatives are not a good long-term solution as it can stress  the bowels and cause too much mineral loss and dehydration.   Avoid taking laxatives for more than 7 days in a row.  AVOID DIARRHEA Switch to liquids and simpler foods for a  few days to avoid stressing your intestines further. Avoid dairy products (especially milk & ice cream) for a short time.  The intestines often can lose the ability to digest lactose when stressed. Avoid foods that cause gassiness or bloating.  Typical foods include beans and other legumes, cabbage, broccoli, and dairy foods.  Every person has some sensitivity to other foods, so listen to your body and avoid those foods that trigger problems for you. Adding fiber (Citrucel, Metamucil, FiberCon, Flax seed, Miralax) gradually can help thicken stools by absorbing excess fluid and retrain the intestines to act more normally.  Slowly increase the dose over a few weeks.  Too much fiber too soon can backfire and cause cramping & bloating. Probiotics (such as active yogurt, Align, etc) may help repopulate the intestines and colon with normal bacteria and calm down a sensitive digestive tract.  Most studies show it to be of mild help, though, and such products can be costly. Medicines: Bismuth subsalicylate (ex. Kayopectate, Pepto Bismol) every 30 minutes for up to 6 doses can help control diarrhea.  Avoid if pregnant. Loperamide (Immodium) can slow down diarrhea.  Start with two tablets (4mg  total) first and then try one tablet every 6 hours.  Avoid if you are having fevers or severe pain.  If you are not better or start feeling worse, stop all medicines and call your doctor for advice Call your doctor if you are getting worse or not better.  Sometimes further testing (cultures, endoscopy, X-ray studies, bloodwork, etc) may be needed to help diagnose and treat the cause of the diarrhea.  TROUBLESHOOTING IRREGULAR BOWELS 1) Avoid extremes of bowel movements (no bad constipation/diarrhea) 2) Miralax 17gm mixed in 8oz. water or juice-daily.  May use BID as needed.  3) Gas-x,Phazyme, etc. as needed for gas & bloating.  4) Soft,bland diet. No spicy,greasy,fried foods.  5) Prilosec over-the-counter as needed  6) May hold gluten/wheat products from diet to see if symptoms improve.  7)  May try probiotics (Align, Activa, etc) to help calm the bowels down 7) If symptoms become worse call back immediately.   TREATMENT OF HEMORRHOID FLARE If these preventive measures fail, you must take action right away! Hemorrhoids are one condition that can be mild in the morning and become intolerable by nightfall. Most hemorrhoidal flares take several weeks to calm down.  These suggestions can help: Warm soaks.  This helps more than any topical medication.  Use up to 8 times a day.  Usually sitz baths or sitting in a warm bathtub helps.  Sitting on moist warm towels are helpful.  Switching to ice packs/cool compresses can be helpful  Use a Sitz Bath 4-8 times a day for relief A sitz bath is a warm water bath taken in the sitting position that covers only the hips and buttocks. It may be used for either healing or hygiene purposes. Sitz baths are also used to relieve pain, itching, or muscle spasms. The water may contain medicine. Moist heat will help you heal and relax.  HOME CARE INSTRUCTIONS  Take 3 to 4 sitz baths a day. 6. Fill the bathtub half full with warm water. 7. Sit in the water and open the drain a little. 8. Turn on the warm water to keep the tub half full. Keep the water running constantly. 9. Soak in the water for 15 to 20 minutes. 10. After the sitz bath, pat the affected area dry first. SEEK MEDICAL CARE IF:  You get worse instead of better.  Stop the sitz baths if you get worse.  Normalize your bowels.  Extremes of diarrhea or constipation will make hemorrhoids worse.  One soft bowel movement a day is the goal.  Fiber can help get your bowels regular Wet wipes instead of toilet paper Pain control with a NSAID such as ibuprofen  (Advil) or naproxen (Aleve) or acetaminophen (Tylenol) around the clock.  Narcotics are constipating and should be minimized if possible Topical creams contain steroids (bydrocortisone) or local anesthetic (xylocaine) can help make pain and itching more tolerable.   EVALUATION If hemorrhoids are still causing problems, you could benefit by an evaluation by a surgeon.  The surgeon will obtain a history and examine you.  If hemorrhoids are diagnosed, some therapies can be offered in the office, usually with an anoscope into the less sensitive area of the rectum: -injection of hemorrhoids (sclerotherapy) can scar the blood vessels of the swollen/enlarged hemorrhoids to help shrink them down to a more normal size -rubber banding of the enlarged hemorrhoids to help shrink them down to a more normal size -drainage of the blood clot causing a thrombosed hemorrhoid,  to relieve the severe pain   While 90% of the time such problems from hemorrhoids can be managed without preceding to surgery, sometimes the hemorrhoids require a operation to control the problem (uncontrolled bleeding, prolapse, pain, etc.).   This involves being placed under general anesthesia where the surgeon can confirm the diagnosis and remove, suture, or staple the hemorrhoid(s).  Your surgeon can help you treat the problem appropriately.      Post Anesthesia Home Care Instructions  Activity: Get plenty of rest for the remainder of the day. A responsible individual must stay with you for 24 hours following the procedure.  For the next 24 hours, DO NOT: -Drive a car -Advertising copywriterperate machinery -Drink alcoholic beverages -Take any medication unless instructed by your physician -Make any legal decisions or sign important papers.  Meals: Start with liquid foods such as gelatin or soup. Progress to regular foods as tolerated. Avoid greasy, spicy, heavy foods. If nausea and/or vomiting occur, drink only clear liquids until the nausea and/or  vomiting subsides. Call your physician if vomiting continues.  Special Instructions/Symptoms: Your throat may feel dry or sore from the anesthesia or the breathing tube placed in your throat during surgery. If this causes discomfort, gargle with warm salt water. The discomfort should disappear within 24 hours.  If you had a scopolamine patch placed behind your ear for the management of post- operative nausea and/or vomiting:  1. The medication in the patch is effective for 72 hours, after which it should be removed.  Wrap patch in a tissue and discard in the trash. Wash hands thoroughly with soap and water. 2. You may remove the patch earlier than 72 hours if you experience unpleasant side effects which may include dry mouth, dizziness or visual disturbances. 3. Avoid touching the patch. Wash your hands with soap and water after contact with the patch.

## 2017-01-02 NOTE — Interval H&P Note (Signed)
History and Physical Interval Note:  01/02/2017 7:23 AM  Antonio Fuller  has presented today for surgery, with the diagnosis of HEMORRHOIDS PROLAPSED GRADE 4. SKIN MASS LEFT UPPER GLUTEUS  The various methods of treatment have been discussed with the patient and family. After consideration of risks, benefits and other options for treatment, the patient has consented to  Procedure(s) with comments: ANORECTAL EXAM UNDER ANESTHESIA WITH HEMORRHOIDECTOMY AND HEMORRHOIDAL LIGATION/PEXY (N/A) - GENERAL AND LOCAL REMOVAL OF MASS ON LEFT UPPER GLUTEUS (Left) - GENERAL AND LOCAL as a surgical intervention .  The patient's history has been reviewed, patient examined, no change in status, stable for surgery.  I have reviewed the patient's chart and labs.  Questions were answered to the patient's satisfaction.    I have re-reviewed the the patient's records, history, medications, and allergies.  I have re-examined the patient.  I again discussed intraoperative plans and goals of post-operative recovery.  The patient agrees to proceed.  Antonio Fuller  [birth year] [id number]  Patient Care Team: Blair Heys, MD as PCP - General (Family Medicine) Rollene Rotunda, MD (Cardiology) Karie Soda, MD as Consulting Physician (General Surgery) Sharrell Ku, MD as Consulting Physician (Gastroenterology)  Patient Active Problem List   Diagnosis Date Noted  . Obesity 10/31/2010  . Cardiomyopathy, dilated, nonischemic (HCC)   . Sleep apnea   . Tobacco user   . Hypertension     Past Medical History:  Diagnosis Date  . Anxiety   . Cardiomyopathy, dilated, nonischemic (HCC) cardiolgoist-  dr hochrein   hx ef 20% per cardiac cath in 2003, last echo 01/ 2016  ef 45%  . Dyspnea on exertion   . GERD (gastroesophageal reflux disease)    per pt drinks water  . Hypercholesteremia   . Hypertension   . OSA on CPAP   . Pre-diabetes   . Productive cough    smoker  . Smokers' cough (HCC)   . Tobacco user    . Wears glasses     Past Surgical History:  Procedure Laterality Date  . APPENDECTOMY  1990s  . CARDIAC CATHETERIZATION  03-22-2001  dr Maylon Cos   severe LV dysfunction in a global matter;  normal coronaries, ef 20%  . COLONOSCOPY  last one 2016  . HEMORRHOID SURGERY  2008 approx.  . TONSILLECTOMY  child  . TRANSTHORACIC ECHOCARDIOGRAM  02-23-2014  dr hochrein   mild focal hypertrophy of the septum, ef 45-50%,  grade 1 diastolic dysfunction/  trivial MR and TR (no change when compared to last study 02-20-2010)  . UMBILICAL HERNIA REPAIR  1990s    Social History   Socioeconomic History  . Marital status: Married    Spouse name: Not on file  . Number of children: Not on file  . Years of education: Not on file  . Highest education level: Not on file  Social Needs  . Financial resource strain: Not on file  . Food insecurity - worry: Not on file  . Food insecurity - inability: Not on file  . Transportation needs - medical: Not on file  . Transportation needs - non-medical: Not on file  Occupational History  . Occupation: Firefighter: [employer]  Tobacco Use  . Smoking status: Current Every Day Smoker    Packs/day: 0.50    Years: 40.00    Pack years: 20.00    Types: Cigarettes  . Smokeless tobacco: Never Used  Substance and Sexual Activity  . Alcohol use: Yes    Alcohol/week:  21.0 oz    Types: 35 Standard drinks or equivalent per week    Comment: 3-5 drinks per day  . Drug use: No  . Sexual activity: Not on file  Other Topics Concern  . Not on file  Social History Narrative  . Not on file    Family History  Problem Relation Age of Onset  . Hypertension Mother   . Hypertension Father     Medications Prior to Admission  Medication Sig Dispense Refill Last Dose  . amLODipine (NORVASC) 5 MG tablet Take 1 tablet every morning by mouth.    01/01/2017 at Unknown time  . aspirin 81 MG tablet Take 81 mg by mouth daily.     12/30/2016 at Unknown time  .  atorvastatin (LIPITOR) 20 MG tablet Take 1 tablet (20 mg total) by mouth daily. (Patient taking differently: Take 20 mg every morning by mouth. ) 30 tablet 11 01/01/2017 at Unknown time  . busPIRone (BUSPAR) 10 MG tablet Take 10 mg 2 (two) times daily by mouth.   01/01/2017 at Unknown time  . carvedilol (COREG) 25 MG tablet take 1 tablet by mouth twice a day 30 tablet 9 01/02/2017 at 0415  . DIGITEK 250 MCG tablet take 1 tablet by mouth once daily (Patient taking differently: take 1 tablet by mouth once daily---  takes in am) 30 tablet 10 01/01/2017 at Unknown time  . ezetimibe (ZETIA) 10 MG tablet take 1 tablet by mouth once daily (Patient taking differently: take 1 tablet by mouth once daily--- takes in am) 90 tablet 2 01/01/2017 at Unknown time  . lisinopril (PRINIVIL,ZESTRIL) 40 MG tablet Take 40 mg every morning by mouth.    01/01/2017 at Unknown time  . Multiple Vitamin (MULTIVITAMIN) tablet Take 1 tablet by mouth daily.     01/01/2017 at Unknown time  . PARoxetine (PAXIL-CR) 25 MG 24 hr tablet Take 1 tablet (25 mg total) by mouth 2 (two) times daily. 60 tablet 0 01/01/2017 at Unknown time  . potassium chloride SA (K-DUR,KLOR-CON) 20 MEQ tablet take 1 tablet by mouth once daily (Patient taking differently: take 1 tablet by mouth once daily--- takes in am) 30 tablet 9 01/01/2017 at Unknown time  . fluticasone (FLONASE) 50 MCG/ACT nasal spray Place as needed into both nostrils.    More than a month at Unknown time    Current Facility-Administered Medications  Medication Dose Route Frequency Provider Last Rate Last Dose  . bupivacaine liposome (EXPAREL) 1.3 % injection 266 mg  20 mL Infiltration On Call to OR Karie SodaGross, [place]er[place], MD      . ceFAZolin (ANCEF) IVPB 2g/100 mL premix  2 g Intravenous On Call to OR Karie SodaGross, Xareni Kelch, MD       And  . metroNIDAZOLE (FLAGYL) IVPB 500 mg  500 mg Intravenous On Call to OR Karie SodaGross, Ajdin Macke, MD      . Chlorhexidine Gluconate Cloth 2 % PADS 6 each  6 each Topical Once  Karie SodaGross, Raiyah Speakman, MD       And  . Chlorhexidine Gluconate Cloth 2 % PADS 6 each  6 each Topical Once Karie SodaGross, Devontay Celaya, MD      . lactated ringers infusion   Intravenous Continuous Cecile Hearingurk, Stephen Edward, MD 50 mL/hr at 01/02/17 0700       No Known Allergies  BP (!) 142/83   Pulse 76   Temp 97.9 F (36.6 C) (Oral)   Resp 18   Ht 6\' 2"  (1.88 m)   Wt 97.3 kg (214 lb  8 oz)   SpO2 97%   BMI 27.54 kg/m   Labs: Results for orders placed or performed during the hospital encounter of 01/02/17 (from the past 48 hour(s))  I-STAT, chem 8     Status: Abnormal   Collection Time: 01/02/17  7:13 AM  Result Value Ref Range   Sodium 141 135 - 145 mmol/L   Potassium 4.1 3.5 - 5.1 mmol/L   Chloride 100 (L) 101 - 111 mmol/L   BUN 13 6 - 20 mg/dL   Creatinine, Ser 1.610.90 0.61 - 1.24 mg/dL   Glucose, Bld 096100 (H) 65 - 99 mg/dL   Calcium, Ion 0.451.18 4.091.15 - 1.40 mmol/L   TCO2 28 22 - 32 mmol/L   Hemoglobin 15.3 13.0 - 17.0 g/dL   HCT 81.145.0 91.439.0 - 78.252.0 %    Imaging / Studies: No results found.   Ardeth Sportsman.Naif Alabi C. Denilson Salminen, M.D., F.A.C.S. Gastrointestinal and Minimally Invasive Surgery [facility] [address] [place], [place] [address] [phone] Main / Paging  01/02/2017 7:23 AM     Jarvis MorganStevem C Sarp Vernier

## 2017-01-02 NOTE — Anesthesia Preprocedure Evaluation (Addendum)
Anesthesia Evaluation  Patient identified by MRN, date of birth, ID band Patient awake    Reviewed: Allergy & Precautions, NPO status , Patient's Chart, lab work & pertinent test results, reviewed documented beta blocker date and time   Airway Mallampati: II  TM Distance: >3 FB Neck ROM: Full    Dental  (+) Teeth Intact, Dental Advisory Given   Pulmonary sleep apnea and Continuous Positive Airway Pressure Ventilation , Current Smoker (pt smoked this AM),    Pulmonary exam normal breath sounds clear to auscultation       Cardiovascular hypertension, Pt. on medications and Pt. on home beta blockers +CHF (NICM)  Normal cardiovascular exam Rhythm:Regular Rate:Normal  Echo 02/23/14: Study Conclusions  - Left ventricle: The cavity size was mildly dilated. There wasmild focal basal hypertrophy of the septum. Systolic function wasmildly reduced. The estimated ejection fraction was in the rangeof 45% to 50%. Diffuse hypokinesis. Doppler parameters areconsistent with abnormal left ventricular relaxation (grade 1diastolic dysfunction). There was no evidence of elevated ventricular filling pressure by Doppler parameters. - Aortic root: The aortic root was normal in size. - Mitral valve: Structurally normal valve. There was trivial regurgitation. - Right ventricle: Systolic function was normal. - Right atrium: The atrium was normal in size. - Tricuspid valve: There was trivial regurgitation. - Pulmonary arteries: Systolic pressure was within the normalrange. - Inferior vena cava: The vessel was normal in size. The respirophasic diameter changes were in the normal range (= 50%),consistent with normal central venous pressure. - Pericardium, extracardiac: There was no pericardial effusion.   Neuro/Psych PSYCHIATRIC DISORDERS Anxiety negative neurological ROS     GI/Hepatic Neg liver ROS, GERD  Medicated and Controlled,  Endo/Other   negative endocrine ROS  Renal/GU negative Renal ROS     Musculoskeletal negative musculoskeletal ROS (+)   Abdominal   Peds  Hematology negative hematology ROS (+)   Anesthesia Other Findings Day of surgery medications reviewed with the patient.  Reproductive/Obstetrics                           Anesthesia Physical Anesthesia Plan  ASA: III  Anesthesia Plan: General   Post-op Pain Management:    Induction: Intravenous  PONV Risk Score and Plan: 2 and Dexamethasone, Ondansetron and Midazolam  Airway Management Planned: Oral ETT  Additional Equipment:   Intra-op Plan:   Post-operative Plan: Extubation in OR  Informed Consent: I have reviewed the patients History and Physical, chart, labs and discussed the procedure including the risks, benefits and alternatives for the proposed anesthesia with the patient or authorized representative who has indicated his/her understanding and acceptance.   Dental advisory given  Plan Discussed with: CRNA  Anesthesia Plan Comments: (Risks/benefits of general anesthesia discussed with patient including risk of damage to teeth, lips, gum, and tongue, nausea/vomiting, allergic reactions to medications, and the possibility of heart attack, stroke and death.  All patient questions answered.  Patient wishes to proceed.  Confirm Beta Blocker use this AM, if negative, esmolol IV with induction.)       Anesthesia Quick Evaluation

## 2017-01-02 NOTE — Op Note (Addendum)
01/02/2017  8:56 AM  PATIENT:  Antonio CritchleyPaul W Porro  66 y.o. male  Patient Care Team: Blair HeysEhinger, Robert, MD as PCP - General (Family Medicine) Rollene RotundaHochrein, James, MD (Cardiology) Karie SodaGross, Salil Raineri, MD as Consulting Physician (General Surgery) Sharrell KuMedoff, Jeffrey, MD as Consulting Physician (Gastroenterology)  PRE-OPERATIVE DIAGNOSIS:  HEMORRHOIDS PROLAPSED GRADE 4. SKIN MASS LEFT UPPER GLUTEUS  POST-OPERATIVE DIAGNOSIS:    HEMORRHOIDS PROLAPSED GRADE 4.  SKIN MASS LEFT UPPER BUTTOCK  PROCEDURE:    HEMORRHOIDECTOMY X2 HEMORRHOIDAL LIGATION/PEXY ANORECTAL EXAM UNDER ANESTHESIA REMOVAL OF MASS ON LEFT UPPER GLUTEUS  SURGEON:  Ardeth SportsmanSteven C. Kenyanna Grzesiak, MD  ASSISTANT: none   ANESTHESIA:   Local field block Anorectal block (0.25% bupivacaine with epinephrine & Liposomal bupivacaine (Experel)) General  EBL:  Total I/O In: -  Out: 10 [Blood:10]  Delay start of Pharmacological VTE agent (>24hrs) due to surgical blood loss or risk of bleeding:  no  DRAINS: none   SPECIMEN:  Source of Specimen:  Hemorrhoids (left anterior / left posteriolateral)  DISPOSITION OF SPECIMEN:  PATHOLOGY  COUNTS:  YES  PLAN OF CARE: Discharge to home after PACU  PATIENT DISPOSITION:  PACU - hemodynamically stable.  INDICATION: Patient with history of hemorrhoid problems for many years.  Prior hemorrhoid surgery.  Banding.  Now with chronically prolapsed hemorrhoids and discomfort.  I recommended examination under anesthesia with hemorrhoidal ligation/pexy and probable hemorrhoidectomy  The anatomy & physiology of the anorectal region was discussed.  The pathophysiology of hemorrhoids and differential diagnosis was discussed.  Natural history risks without surgery was discussed.   I stressed the importance of a bowel regimen to have daily soft bowel movements to minimize progression of disease.  Interventions such as sclerotherapy & banding were discussed.  The patient's symptoms are not adequately controlled by  medicines and other non-operative treatments.  I feel the risks & problems of no surgery outweigh the operative risks; therefore, I recommended surgery to treat the hemorrhoids by ligation, pexy, and possible resection.  Risks such as bleeding, infection, urinary difficulties, need for further treatment, heart attack, death, and other risks were discussed.   I noted a good likelihood this will help address the problem.  Goals of post-operative recovery were discussed as well.  Possibility that this will not correct all symptoms was explained.  Post-operative pain, bleeding, constipation, and other problems after surgery were discussed.  We will work to minimize complications.   Educational handouts further explaining the pathology, treatment options, and bowel regimen were given as well.  Questions were answered.  The patient expresses understanding & wishes to proceed with surgery.  OR FINDINGS: Left anterior grade 4 internal hemorrhoid with external component.  Ligated and excised.  Left posterior grade 3 internal hemorrhoid.  Right lateral grade 2 hemorrhoids  DESCRIPTION:   Informed consent was confirmed. Patient underwent general anesthesia without difficulty. Patient was placed into prone positioning.  The perianal region was prepped and draped in sterile fashion. Surgical time-out confirmed our plan.  I excised the pedunculated skin mass off the left upper gluteal region.  Did a 2 x 1 cm excision of the base to remove the 2 x 2 x 2 cm pedunculated skin mass.  Seems like a benign acrochordon.  Sent to pathology.  I did digital rectal examination and then transitioned over to anoscopy to get a sense of the anatomy.  I proceeded to ligate the hemorrhoidal arteries. I used a 2-0 Vicryl suture on a UR-6 needle in a figure-of-eight fashion over the signal around 6 cm proximal  to the anal verge. I then ran that stitch longitudinally more distally to the white line of [place]. I then tied that stitch  down to cause a hemorrhoidopexy. I did that for all 6 locations.  Patient had persistently enlarged hemorrhoids that required excision in the left anterior and left posterior lateral hemorrhoid locations they were excised longitudinally and a bio concave fusiform fashion, sparing anoderm.  The hemorrhoidal column sutures were used to close that to the anal verge.  I did have to excise left anterior external hemorrhoid component.  Did have to do an extra running suture in the right anterior aspect as well.  At completion of this, all hemorrhoids were reduced into the rectum. There is no more prolapse.  Closed the external hemorrhoidectomy wound in the left anterior aspect with interrupted horizontal mattress 2-0 chromic suture to good result.    External anatomy looked normal.  I repeated anoscopy and examination.  Hemostasis was good.  Patient is being extubated go to recovery room.  I had discussed postop care in detail with the patient and his spouse in the preop holding area.  Instructions had been given in the office.  They will be given again. I made an attempt to locate family to discuss patient's status and recommendations.  No one is available at this time.  I will try again later        Ardeth SportsmanSteven C. Journii Nierman, M.D., F.A.C.S. Gastrointestinal and Minimally Invasive Surgery [facility] [address] [place], [place] [address] [phone] Main / Paging

## 2017-01-02 NOTE — Anesthesia Procedure Notes (Signed)
Procedure Name: Intubation Date/Time: 01/02/2017 7:40 AM Performed by: Catalina Gravel, MD Pre-anesthesia Checklist: Patient identified, Patient being monitored and Suction available Patient Re-evaluated:Patient Re-evaluated prior to induction Oxygen Delivery Method: Circle system utilized Preoxygenation: Pre-oxygenation with 100% oxygen Induction Type: IV induction Ventilation: Mask ventilation without difficulty and Oral airway inserted - appropriate to patient size Laryngoscope Size: Glidescope and 4 Grade View: Grade I Tube type: Oral Tube size: 7.5 mm Number of attempts: 3 Airway Equipment and Method: Video-laryngoscopy Placement Confirmation: ETT inserted through vocal cords under direct vision,  positive ETCO2 and breath sounds checked- equal and bilateral Secured at: 23 cm Tube secured with: Tape Dental Injury: Teeth and Oropharynx as per pre-operative assessment  Difficulty Due To: Difficulty was unanticipated Comments: Attempt x1 by CRNA with Grade 4 view.  Attempt x1 by MDA with MAC 4 with Grade 3 view, unable to pass ETT.  Glidescope with size 4 blade with easy insertion of ETT through glottis by MDA.

## 2017-01-02 NOTE — Anesthesia Procedure Notes (Addendum)
Procedure Name: Intubation Date/Time: 01/02/2017 7:40 AM Performed by: Wanita Chamberlain, CRNA Pre-anesthesia Checklist: Patient identified, Timeout performed, Emergency Drugs available, Suction available and Patient being monitored Patient Re-evaluated:Patient Re-evaluated prior to induction Oxygen Delivery Method: Circle system utilized Preoxygenation: Pre-oxygenation with 100% oxygen Induction Type: IV induction and Cricoid Pressure applied Ventilation: Mask ventilation without difficulty and Oral airway inserted - appropriate to patient size Laryngoscope Size: Mac and 4 Grade View: Grade III Tube type: Oral Tube size: 8.0 mm Number of attempts: 3 Airway Equipment and Method: Rigid stylet and Video-laryngoscopy Placement Confirmation: ETT inserted through vocal cords under direct vision,  positive ETCO2 and breath sounds checked- equal and bilateral Secured at: 23 cm Dental Injury: Teeth and Oropharynx as per pre-operative assessment  Difficulty Due To: Difficulty was unanticipated and Difficult Airway- due to immobile epiglottis Comments: Pt put to sleep on stretcher. Good mask airway w/ OA in place. View 1 by Jayona Mccaig Grade IV unable to lift epiglottis. Dr Gifford Shave view 1 same view. Video Glidescope Blade Mac 4 with grade 1 view and pt. then intubated with ease. VSS throughout intubation.

## 2017-01-02 NOTE — Transfer of Care (Signed)
Immediate Anesthesia Transfer of Care Note  Patient: Antonio Fuller  Procedure(s) Performed: ANORECTAL EXAM UNDER ANESTHESIA WITH HEMORRHOIDECTOMY AND HEMORRHOIDAL LIGATION/PEXY X2 (N/A Anus) REMOVAL OF MASS ON LEFT UPPER GLUTEUS (Left Buttocks)  Patient Location: PACU  Anesthesia Type:General  Level of Consciousness: awake, alert , oriented and patient cooperative  Airway & Oxygen Therapy: Patient Spontanous Breathing and Patient connected to face mask oxygen  Post-op Assessment: Report given to RN and Post -op Vital signs reviewed and stable  Post vital signs: Reviewed and stable  Last Vitals:  Vitals:   01/02/17 0546  BP: (!) 142/83  Pulse: 76  Resp: 18  Temp: 36.6 C  SpO2: 97%    Last Pain:  Vitals:   01/02/17 0546  TempSrc: Oral      Patients Stated Pain Goal: 6 (01/02/17 [id number])  Complications: No apparent anesthesia complications

## 2017-01-02 NOTE — H&P (Signed)
Antonio Fuller  Location: [facility] Patient #: [id number] DOB: [birth year] Married / Language: English / Race: White Male   History of Present Illness  The patient is a 66 year old male who presents with hemorrhoids. Note for "Hemorrhoids": ` ` ` Patient Care Team: Blair HeysEhinger, Robert, MD as PCP - General (Family Medicine) Rollene RotundaHochrein, James, MD (Cardiology)   Patient sent for surgical consultation at the request of Dr. Manus GunningEhinger  Chief Complaint: Hemorrhoids  The patient is a pleasant gentleman that has trouble with hemorrhoids for many decades. There is. Required incision and drainage twice. Sounds like thrombosed hemorrhoids. Recalls a separate time getting and injected. A band with his last colonoscopy by Dr. Kinnie ScalesMedoff. He had an operation at one point for hemorrhoids as well. Aside from the banding with the colonoscopy, he has not needed much done in the past 10 years. However, more recently he noticed increased swelling and pain with bowel movements. Concern for worsening hemorrhoid flares. He had a tube of triamcinolone high potency steroid cream. He's been putting it on his hemorrhoids. That helped a little bit. However he still gets pain with wiping. Also he has a chronic groin rash. Recalls someone telling him to use Selsun Blue dandruff shampoo to help it go away. That did not work. Patient usually moves his bowels about every day now since he switched over to adding scoop bran flakes every day. That is help counteract his usual chronic constipation. He was concerned. Discussed with his primary care physician. Surgical consultation requested  No personal nor family history of GI/colon cancer, inflammatory bowel disease, irritable bowel syndrome, allergy such as Celiac Sprue, dietary/dairy problems, colitis, ulcers nor gastritis. No recent sick contacts/gastroenteritis. No travel outside the country. No changes in diet. No dysphagia to solids or liquids. No  significant heartburn or reflux. No hematochezia, hematemesis, coffee ground emesis. No evidence of prior gastric/peptic ulceration. 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 He is remain abstinence and his much better shape. He is followed by Dr. Antoine PocheHochrein. He saw him early in the year and felt like he is cleared from a cardiology standpoint.  (Review of systems as stated in this history (HPI) or in the review of systems. Otherwise all other 12 point ROS are negative)   Past Surgical History Christianne Dolin(Christen Lambert, RMA; 12/10/2016 8:52 AM) Appendectomy  Colon Polyp Removal - Colonoscopy  Hemorrhoidectomy  Oral Surgery  Tonsillectomy  Ventral / Umbilical Hernia Surgery  Right.  Diagnostic Studies History Christianne Dolin(Christen Lambert, [place]; 12/10/2016 8:52 AM) Colonoscopy  1-5 years ago  Allergies Christianne Dolin(Christen Lambert, RMA; 12/10/2016 8:53 AM) No Known Allergies 12/10/2016  Medication History Christianne Dolin(Christen Lambert, RMA; 12/10/2016 8:55 AM) AmLODIPine Besylate (5MG  Tablet, Oral) Active. Atorvastatin Calcium (20MG  Tablet, Oral) Active. BusPIRone HCl (10MG  Tablet, Oral) Active. Carvedilol (25MG  Tablet, Oral) Active. Digox (250MCG Tablet, Oral) Active. Ezetimibe (10MG  Tablet, Oral) Active. Lisinopril (40MG  Tablet, Oral) Active. PARoxetine HCl ER (25MG  Tablet ER 24HR, Oral) Active. Triamcinolone Acetonide (0.5% Cream, External) Active. Lanoxin (Oral) Specific strength unknown - Active. Aspirin (81MG  Tablet, Oral) Active. Medications Reconciled  Social History Christianne Dolin(Christen Lambert, [place]; 12/10/2016 8:53 AM) Alcohol use  Heavy alcohol use. Caffeine use  Tea. No drug use  Tobacco use  Current every day smoker.  Family History Christianne Dolin(Christen Lambert, [place]; 12/10/2016 8:52 AM) Alcohol Abuse  Brother, Sister, Son. Arthritis  Mother. Cancer  Mother. Heart Disease  Father. Heart disease in male family member before age 65  Hypertension  Mother. Migraine Headache  Daughter. Prostate Cancer  Family Members In General.  Other Problems Christianne Dolin(Christen Lambert, [place]; 12/10/2016 8:53 AM) Alcohol Abuse  Anxiety Disorder  Depression  Hemorrhoids  High blood pressure  Hypercholesterolemia  Kidney Stone  Sleep Apnea  Umbilical Hernia Repair     Review of Systems Christianne Dolin(Christen Lambert RMA; 12/10/2016 8:53 AM) General Not Present- Appetite Loss, Chills, Fatigue, Fever, Night Sweats, Weight Gain and Weight Loss. Skin Not Present- Change in Wart/Mole, Dryness, Hives, Jaundice, New Lesions, Non-Healing Wounds, Rash and Ulcer. HEENT Present- Sore Throat and Wears glasses/contact lenses. Not Present- Earache, Hearing Loss, Hoarseness, Nose Bleed, Oral Ulcers, Ringing in the Ears, Seasonal Allergies, Sinus Pain, Visual Disturbances and Yellow Eyes. Respiratory Present- Chronic Cough and Snoring. Not Present- Bloody sputum, Difficulty Breathing and Wheezing. Breast Not Present- Breast Mass, Breast Pain, Nipple Discharge and Skin Changes. Cardiovascular Not Present- Chest Pain, Difficulty Breathing Lying Down, Leg Cramps, Palpitations, Rapid Heart Rate, Shortness of Breath and Swelling of Extremities. Gastrointestinal Present- Hemorrhoids. Not Present- Abdominal Pain, Bloating, Bloody Stool, Change in Bowel Habits, Chronic diarrhea, Constipation, Difficulty Swallowing, Excessive gas, Gets full quickly at meals, Indigestion, Nausea, Rectal Pain and Vomiting. Musculoskeletal Not Present- Back Pain, Joint Pain, Joint Stiffness, Muscle Pain, Muscle Weakness and Swelling of Extremities. Neurological Not Present- Decreased Memory, Fainting, Headaches, Numbness, Seizures, Tingling, Tremor, Trouble walking and Weakness. Psychiatric Present- Anxiety. Not Present- Bipolar, Change in Sleep Pattern, Depression, Fearful and Frequent crying. Endocrine Not Present- Cold Intolerance, Excessive Hunger, Hair Changes, Heat Intolerance, Hot flashes and New Diabetes. Hematology Not  Present- Blood Thinners, Easy Bruising, Excessive bleeding, Gland problems, HIV and Persistent Infections.  Vitals Christianne Dolin(Christen Lambert RMA; 12/10/2016 8:55 AM) 12/10/2016 8:55 AM Weight: 217.2 lb Height: 74in Body Surface Area: 2.25 m Body Mass Index: 27.89 kg/m  Temp.: 97.76F  Pulse: 73 (Regular)  BP: 132/80 (Sitting, Left Arm, Standard)       Physical Exam Ardeth Sportsman(Angeli Demilio C. Kule Gascoigne MD; 12/10/2016 9:23 AM) General Mental Status-Alert. General Appearance-Not in acute distress, Not Sickly. Orientation-Oriented X3. Hydration-Well hydrated. Voice-Normal.  Integumentary Global Assessment Upon inspection and palpation of skin surfaces of the - Axillae: non-tender, no inflammation or ulceration, no drainage. and Distribution of scalp and body hair is normal. General Characteristics Temperature - normal warmth is noted.  Head and Neck Head-normocephalic, atraumatic with no lesions or palpable masses. Face Global Assessment - atraumatic, no absence of expression. Neck Global Assessment - no abnormal movements, no bruit auscultated on the right, no bruit auscultated on the left, no decreased range of motion, non-tender. Trachea-midline. Thyroid Gland Characteristics - non-tender.  Eye Eyeball - Left-Extraocular movements intact, No Nystagmus. Eyeball - Right-Extraocular movements intact, No Nystagmus. Cornea - Left-No Hazy. Cornea - Right-No Hazy. Sclera/Conjunctiva - Left-No scleral icterus, No Discharge. Sclera/Conjunctiva - Right-No scleral icterus, No Discharge. Pupil - Left-Direct reaction to light normal. Pupil - Right-Direct reaction to light normal.  ENMT Ears Pinna - Left - no drainage observed, no generalized tenderness observed. Right - no drainage observed, no generalized tenderness observed. Nose and Sinuses External Inspection of the Nose - no destructive lesion observed. Inspection of the nares - Left - quiet respiration.  Right - quiet respiration. Mouth and Throat Lips - Upper Lip - no fissures observed, no pallor noted. Lower Lip - no fissures observed, no pallor noted. Nasopharynx - no discharge present. Oral Cavity/Oropharynx - Tongue - no dryness observed. Oral Mucosa - no cyanosis observed. Hypopharynx - no evidence of airway distress observed.  Chest and Lung Exam Inspection Movements - Normal and Symmetrical.  Accessory muscles - No use of accessory muscles in breathing. Palpation Palpation of the chest reveals - Non-tender. Auscultation Breath sounds - Normal and Clear.  Cardiovascular Auscultation Rhythm - Regular. Murmurs & Other Heart Sounds - Auscultation of the heart reveals - No Murmurs and No Systolic Clicks.  Abdomen Inspection Inspection of the abdomen reveals - No Visible peristalsis and No Abnormal pulsations. Umbilicus - No Bleeding, No Urine drainage. Palpation/Percussion Palpation and Percussion of the abdomen reveal - Soft, Non Tender, No Rebound tenderness, No Rigidity (guarding) and No Cutaneous hyperesthesia. Note: Abdomen soft. Diastasis recti. Nontender. Not distended. No umbilical or incisional hernias. No guarding.   Male Genitourinary Sexual Maturity Tanner 5 - Adult hair pattern and Adult penile size and shape. Note: No inguinal hernias. Moderate groin rash. Normal external genitalia. Epididymi, testes, and spermatic cords normal without any masses.   Rectal Note: Refer to anoscopy section. Anterior Garde 4 / external hemorrhoid. Mild pruritus. Inflamed internal hemorrhoids grade 2 Also skin tag on left upper outer gluteal/lower back region. 2.5 cm pedunculated   Peripheral Vascular Upper Extremity Inspection - Left - No Cyanotic nailbeds, Not Ischemic. Right - No Cyanotic nailbeds, Not Ischemic.  Neurologic Neurologic evaluation reveals -normal attention span and ability to concentrate, able to name objects and repeat phrases. Appropriate fund of  knowledge , normal sensation and normal coordination. Mental Status Affect - not angry, not paranoid. Cranial Nerves-Normal Bilaterally. Gait-Normal.  Neuropsychiatric Mental status exam performed with findings of-able to articulate well with normal speech/language, rate, volume and coherence, thought content normal with ability to perform basic computations and apply abstract reasoning and no evidence of hallucinations, delusions, obsessions or homicidal/suicidal ideation.  Musculoskeletal Global Assessment Spine, Ribs and Pelvis - no instability, subluxation or laxity. Right Upper Extremity - no instability, subluxation or laxity.  Lymphatic Head & Neck  General Head & Neck Lymphatics: Bilateral - Description - No Localized lymphadenopathy. Axillary  General Axillary Region: Bilateral - Description - No Localized lymphadenopathy. Femoral & Inguinal  Generalized Femoral & Inguinal Lymphatics: Left - Description - No Localized lymphadenopathy. Right - Description - No Localized lymphadenopathy.   Results Ardeth Sportsman MD; 12/10/2016 9:42 AM) Procedures  Name Value Date Hemorrhoids Procedure Anal exam: External Hemorrhoid Internal exam: Internal Hemorroids ( non-bleeding) prolapse Other: Obvious anterior hemorrhoid. Favor left anterior. Internal component as well. Grade 2 inflamed internal hemorrhoids as well. Some friable. The anterior proctitis. Nothing too severe. Prostate soft. Mild pruritus. Perianal skin clean with good hygiene. No pilonidal disease. No fissure. No abscess/fistula. Normal sphincter tone. No condyloma warts. Tolerates digital and anoscopic rectal exam. No rectal masses. Hemorrhoidal piles normal.  Performed: 12/10/2016 9:25 AM    Assessment & Plan Ardeth Sportsman MD; 12/10/2016 9:42 AM) PROLAPSED INTERNAL HEMORRHOIDS, GRADE 2 (K64.1) Impression: Problematic internal hemorrhoids. Would benefit from hemorrhoidal  ligation and pexy. Persistent despite prior banding & injections. Current Plans ANOSCOPY, DIAGNOSTIC (16109) Pt Education - [facility] Hemorrhoids (Tauri E[place]ton): discussed with patient and provided information. EXTERNAL HEMORRHOIDS WITH COMPLICATION (K64.4) Impression: Irritated anterior hemorrhoid. Seems becoming more left anterior. Will not get better without removal. I think it is a source of his pruritus and other issues. He agrees with proceeding with surgery.  Hopefully with is improved fiber bowel regimen and better bowel movements, less likely he will grow new problems. Current Plans The anatomy & physiology of the anorectal region was discussed. The pathophysiology of hemorrhoids and differential diagnosis was discussed. Natural history risks without surgery was discussed. I stressed the importance of  a bowel regimen to have daily soft bowel movements to minimize progression of disease. Interventions such as sclerotherapy & banding were discussed.  The patient's symptoms are not adequately controlled by medicines and other non-operative treatments. I feel the risks & problems of no surgery outweigh the operative risks; therefore, I recommended surgery to treat the hemorrhoids by ligation, pexy, and possible resection.  Risks such as bleeding, infection, urinary difficulties, need for further treatment, heart attack, death, and other risks were discussed. I noted a good likelihood this will help address the problem. Goals of post-operative recovery were discussed as well. Possibility that this will not correct all symptoms was explained. Post-operative pain, bleeding, constipation, and other problems after surgery were discussed. We will work to minimize complications. Educational handouts further explaining the pathology, treatment options, and bowel regimen were given as well. Questions were answered. The patient expresses understanding & wishes to proceed with surgery.  Pt Education -  Pamphlet Given - The Hemorrhoid Book: discussed with patient and provided information. MASS OF SKIN OF BACK (R22.2) Impression: Pedunculated skin mass. Can remove the same time. The following prone positioning. JOCK ITCH (L29.8) Impression: Rash in droins. May be contributing with his chronic perianal pruritus. Seem consistent with classic jock itch. Do antifungal cream since over-the-counter not sufficient. Current Plans Started Ciclodan 0.77%, 1 (one) Application two times daily, 1 Applicator, 7 days starting 12/10/2016, Ref. x5. ENCOUNTER FOR PREOPERATIVE EXAMINATION FOR GENERAL SURGICAL PROCEDURE (Z01.818) Current Plans You are being scheduled for surgery- Our schedulers will call you.  You should hear from our office's scheduling department within 5 working days about the location, date, and time of surgery. We try to make accommodations for patient's preferences in scheduling surgery, but sometimes the OR schedule or the surgeon's schedule prevents [place] from making those accommodations.  If you have not heard from our office [phone]) in 5 working days, call the office and ask for your surgeon's nurse.  If you have other questions about your diagnosis, plan, or surgery, call the office and ask for your surgeon's nurse.  Pt Education - [facility] Rectal Prep for Anorectal outpatient/office surgery: discussed with patient and provided information. Pt Education - [facility] Rectal Surgery HCI (Makyla Bye): discussed with patient and provided information.  Ardeth Sportsman, M.D., F.A.C.S. Gastrointestinal and Minimally Invasive Surgery [facility] [address], [address] [place], [place] [address] [phone] Main / Paging

## 2017-01-05 ENCOUNTER — Encounter (HOSPITAL_BASED_OUTPATIENT_CLINIC_OR_DEPARTMENT_OTHER): Payer: Self-pay | Admitting: Surgery

## 2017-01-19 ENCOUNTER — Other Ambulatory Visit: Payer: Self-pay

## 2017-01-19 MED ORDER — CARVEDILOL 25 MG PO TABS: 25.0000 mg | ORAL_TABLET | Freq: Two times a day (BID) | ORAL | 6 refills | Status: AC

## 2017-03-19 ENCOUNTER — Other Ambulatory Visit: Payer: Self-pay | Admitting: *Deleted

## 2017-03-20 MED ORDER — POTASSIUM CHLORIDE CRYS ER 20 MEQ PO TBCR: 20.0000 meq | EXTENDED_RELEASE_TABLET | Freq: Every day | ORAL | 1 refills | Status: DC

## 2017-03-20 NOTE — Telephone Encounter (Signed)
REFILL 

## 2017-03-25 ENCOUNTER — Encounter: Payer: Self-pay | Admitting: Cardiology

## 2017-03-29 NOTE — Progress Notes (Signed)
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  The etiology this was felt possibly to be related to alcohol.   His most recent echocardiogram in January of 2016 demonstrated an EF of 45%. Several years ago it was as low as 15%.    Since I last saw him he has had increased problems with anxiety and depression.  He thought it might be related to Norvasc which was started around the same time the started getting worse.  The Norvasc was used because of higher blood pressures.  From a cardiovascular standpoint he sounds relatively well.  Does not sound like he exercises routinely.  [place] [employer] [place].  And he still does walk from his car and takes the stairs.  With this he denies any cardiovascular symptoms. The patient denies any new symptoms such as chest discomfort, neck or arm discomfort. There has been no new shortness of breath, PND or orthopnea. There have been no reported palpitations, presyncope or syncope.  No Known Allergies  Current Outpatient Medications  Medication Sig Dispense Refill  . amLODipine (NORVASC) 5 MG tablet Take 1 tablet every morning by mouth.     Marland Kitchen. aspirin 81 MG tablet Take 81 mg by mouth daily.      Marland Kitchen. atorvastatin (LIPITOR) 20 MG tablet Take 1 tablet (20 mg total) by mouth daily. (Patient taking differently: Take 20 mg every morning by mouth. ) 30 tablet 11  . busPIRone (BUSPAR) 10 MG tablet Take 10 mg 2 (two) times daily by mouth.    . carvedilol (COREG) 25 MG tablet Take 1 tablet (25 mg total) by mouth 2 (two) times daily. 30 tablet 6  . ezetimibe (ZETIA) 10 MG tablet take 1 tablet by mouth once daily (Patient taking differently: take 1 tablet by mouth once daily--- takes in am) 90 tablet 2  . fluticasone (FLONASE) 50 MCG/ACT nasal spray Place as needed into both nostrils.     Marland Kitchen. lisinopril (PRINIVIL,ZESTRIL) 40 MG tablet Take 40 mg every morning by mouth.     . Multiple Vitamin (MULTIVITAMIN) tablet Take 1  tablet by mouth daily.      . naproxen (NAPROSYN) 500 MG tablet Take 1 tablet (500 mg total) 2 (two) times daily with a meal by mouth. 40 tablet 1  . oxyCODONE (OXY IR/ROXICODONE) 5 MG immediate release tablet Take 1-2 tablets (5-10 mg total) every 4 (four) hours as needed by mouth for moderate pain, severe pain or breakthrough pain. 40 tablet 0  . PARoxetine (PAXIL-CR) 25 MG 24 hr tablet Take 1 tablet (25 mg total) by mouth 2 (two) times daily. 60 tablet 0  . potassium chloride SA (K-DUR,KLOR-CON) 20 MEQ tablet Take 1 tablet (20 mEq total) by mouth daily. KEEP OV. 30 tablet 1   No current facility-administered medications for this visit.     Past Medical History:  Diagnosis Date  . Anxiety   . Cardiomyopathy, dilated, nonischemic (HCC) cardiolgoist-  dr Quadir Muns   hx ef 20% per cardiac cath in 2003, last echo 01/ 2016  ef 45%  . Dyspnea on exertion   . GERD (gastroesophageal reflux disease)    per pt drinks water  . Hypercholesteremia   . Hypertension   . OSA on CPAP   . Pre-diabetes   . Productive cough    smoker  . Smokers' cough (HCC)   . Tobacco user   . Wears glasses     Past Surgical History:  Procedure  Laterality Date  . APPENDECTOMY  1990s  . CARDIAC CATHETERIZATION  03-22-2001  dr Maylon Cos   severe LV dysfunction in a global matter;  normal coronaries, ef 20%  . COLONOSCOPY  last one 2016  . EVALUATION UNDER ANESTHESIA WITH HEMORRHOIDECTOMY N/A 01/02/2017   Procedure: ANORECTAL EXAM UNDER ANESTHESIA WITH HEMORRHOIDECTOMY AND HEMORRHOIDAL LIGATION/PEXY X2;  Surgeon: Karie Soda, MD;  Location: [facility];  Service: General;  Laterality: N/A;  GENERAL AND LOCAL  . HEMORRHOID SURGERY  2008 approx.  Marland Kitchen MASS EXCISION Left 01/02/2017   Procedure: REMOVAL OF MASS ON LEFT UPPER GLUTEUS;  Surgeon: Karie Soda, MD;  Location[facility] [place];  Service: General;  Laterality: Left;  GENERAL AND LOCAL  . TONSILLECTOMY  child  . TRANSTHORACIC  ECHOCARDIOGRAM  02-23-2014  dr Nicosha Struve   mild focal hypertrophy of the septum, ef 45-50%,  grade 1 diastolic dysfunction/  trivial MR and TR (no change when compared to last study 02-20-2010)  . UMBILICAL HERNIA REPAIR  1990s     ROS:  Positive for fatigue.  Otherwise as stated in the HPI and negative for all other systems.   PHYSICAL EXAM BP 110/82 (BP Location: Left Arm, Patient Position: Sitting, Cuff Size: Large)   Pulse 69   Ht 6\' 2"  (1.88 m)   Wt 221 lb 12.8 oz (100.6 kg)   BMI 28.48 kg/m   GENERAL:  Well appearing NECK:  No jugular venous distention, waveform within normal limits, carotid upstroke brisk and symmetric, no bruits, no thyromegaly LUNGS:  Clear to auscultation bilaterally CHEST:  Unremarkable HEART:  PMI not displaced or sustained,S1 and S2 within normal limits, no S3, no S4, no clicks, no rubs, no murmurs ABD:  Flat, positive bowel sounds normal in frequency in pitch, no bruits, no rebound, no guarding, no midline pulsatile mass, no hepatomegaly, no splenomegaly EXT:  2 plus pulses throughout, no edema, no cyanosis no clubbing    EKG:  Sinus rhythm, rate 69, axis within normal limits, left ventricular hypertrophy by voltage criteria,nonspecific ST-T wave changes.  03/31/2017  ASSESSMENT AND PLAN  Cardiomyopathy, dilated, nonischemic -  I will check an echocardiogram is been 3 years.  I am going to stop his digoxin.  Otherwise he will continue the meds as listed.  Hypertension -  This is being managed in the context of treating his CHF  Tobacco user -  We discussed again the need to stop smoking. He did not tolerate Chantix.  He will consider the nicotine replacement therapy.   ETOH - He drinks a couple of drinks per night and I suggest stopping all together.   Depression - I suggested, as did Blair Heys, MD, that he see a psychiatrist and he has plans to do this.   I will check a TSH.  This has not been checked in two years.

## 2017-03-31 ENCOUNTER — Encounter: Payer: Self-pay | Admitting: Cardiology

## 2017-03-31 ENCOUNTER — Ambulatory Visit: Payer: BC Managed Care – PPO | Admitting: Cardiology

## 2017-03-31 VITALS — BP 110/82 | HR 69 | Ht 74.0 in | Wt 221.8 lb

## 2017-03-31 DIAGNOSIS — R5383 Other fatigue: Secondary | ICD-10-CM | POA: Diagnosis not present

## 2017-03-31 DIAGNOSIS — I10 Essential (primary) hypertension: Secondary | ICD-10-CM

## 2017-03-31 DIAGNOSIS — F32A Depression, unspecified: Secondary | ICD-10-CM

## 2017-03-31 DIAGNOSIS — Z720 Tobacco use: Secondary | ICD-10-CM

## 2017-03-31 DIAGNOSIS — F329 Major depressive disorder, single episode, unspecified: Secondary | ICD-10-CM

## 2017-03-31 DIAGNOSIS — I420 Dilated cardiomyopathy: Secondary | ICD-10-CM

## 2017-03-31 LAB — TSH: TSH: 1.55 u[IU]/mL (ref 0.450–4.500)

## 2017-03-31 NOTE — Patient Instructions (Signed)
Medication Instructions:  STOP- Digoxin   If you need a refill on your cardiac medications before your next appointment, please call your pharmacy.  Labwork: TSH HERE IN OUR OFFICE AT [facility]  Take the provided lab slips for you to take with you to the lab for you blood draw.   You will NOT need to fast   You may go to any [facility] lab that is convenient for you however, we do have a lab in our office that is able to assist you. You do NOT need an appointment for our lab. Once in our office lobby there is a podium to the right of the check-in desk where you are to sign-in and ring a doorbell to alert us you are here. Lab is open Monday-Friday from 8:00am to 4:00pm; and is closed for lunch from 12:45p-1:45pm   Testing/Procedures: Your physician has requested that you have an echocardiogram. Echocardiography is a painless test that uses sound waves to create images of your heart. It provides your doctor with information about the size and shape of your heart and how well your heart's chambers and valves are working. This procedure takes approximately one hour. There are no restrictions for this procedure.   Follow-Up: Your physician wants you to follow-up in: 1 Year. You should receive a reminder letter in the mail two months in advance. If you do not receive a letter, please call our office [phone].   Thank you for choosing [facility]!!

## 2017-04-07 ENCOUNTER — Ambulatory Visit (HOSPITAL_COMMUNITY): Payer: BC Managed Care – PPO | Attending: Cardiology

## 2017-04-07 ENCOUNTER — Other Ambulatory Visit: Payer: Self-pay

## 2017-04-07 DIAGNOSIS — F419 Anxiety disorder, unspecified: Secondary | ICD-10-CM | POA: Diagnosis not present

## 2017-04-07 DIAGNOSIS — G4733 Obstructive sleep apnea (adult) (pediatric): Secondary | ICD-10-CM | POA: Diagnosis not present

## 2017-04-07 DIAGNOSIS — I119 Hypertensive heart disease without heart failure: Secondary | ICD-10-CM | POA: Insufficient documentation

## 2017-04-07 DIAGNOSIS — F17200 Nicotine dependence, unspecified, uncomplicated: Secondary | ICD-10-CM | POA: Diagnosis not present

## 2017-04-07 DIAGNOSIS — I77810 Thoracic aortic ectasia: Secondary | ICD-10-CM | POA: Insufficient documentation

## 2017-04-07 DIAGNOSIS — E7800 Pure hypercholesterolemia, unspecified: Secondary | ICD-10-CM | POA: Diagnosis not present

## 2017-04-07 DIAGNOSIS — I420 Dilated cardiomyopathy: Secondary | ICD-10-CM | POA: Diagnosis present

## 2017-05-29 ENCOUNTER — Other Ambulatory Visit: Payer: Self-pay | Admitting: Cardiology

## 2017-05-29 MED ORDER — POTASSIUM CHLORIDE CRYS ER 20 MEQ PO TBCR: 20.0000 meq | EXTENDED_RELEASE_TABLET | Freq: Every day | ORAL | 2 refills | Status: DC

## 2017-08-24 ENCOUNTER — Other Ambulatory Visit: Payer: Self-pay | Admitting: Cardiology

## 2017-10-13 ENCOUNTER — Other Ambulatory Visit: Payer: Self-pay | Admitting: Cardiology

## 2017-10-13 NOTE — Telephone Encounter (Signed)
Rx sent to pharmacy   

## 2018-03-30 NOTE — Progress Notes (Signed)
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  The etiology this was felt possibly to be related to alcohol.   His most recent echocardiogram in Feb 2019 demonstrated an EF of 45%. Several years ago it was as low as 15%.   He returns for follow up.  Since I last saw him he has had no new cardiovascular complaints.  He unfortunately continues to smoke cigarettes.  He has not had any new shortness of breath, PND or orthopnea.  He has not had any new palpitations, presyncope or syncope.  He had a vague episode of chest discomfort the other day but he was also somewhat panicked and thought this was probably related to some chest congestion that he was having.  He is not been having this routinely and has not had it since.  He is able to do some walking around [place] campus.  He takes the stairs.  He has had no inducible symptoms with this.  He has had no swelling weight gain or edema.   No Known Allergies  Current Outpatient Medications  Medication Sig Dispense Refill  . amLODipine (NORVASC) 5 MG tablet Take 1 tablet every morning by mouth.     Marland Kitchen aspirin 81 MG tablet Take 81 mg by mouth daily.      Marland Kitchen atorvastatin (LIPITOR) 20 MG tablet Take 1 tablet (20 mg total) by mouth daily. (Patient taking differently: Take 20 mg every morning by mouth. ) 30 tablet 11  . carvedilol (COREG) 25 MG tablet Take 1 tablet (25 mg total) by mouth 2 (two) times daily. 30 tablet 6  . ezetimibe (ZETIA) 10 MG tablet TAKE 1 TABLET BY MOUTH ONCE DAILY 90 tablet 0  . fluticasone (FLONASE) 50 MCG/ACT nasal spray Place as needed into both nostrils.     Marland Kitchen lisinopril (PRINIVIL,ZESTRIL) 40 MG tablet Take 40 mg every morning by mouth.     . Multiple Vitamin (MULTIVITAMIN) tablet Take 1 tablet by mouth daily.      . nortriptyline (PAMELOR) 25 MG capsule Take 25 mg by mouth at bedtime.     . potassium chloride SA (K-DUR,KLOR-CON) 20 MEQ tablet TAKE 1 TABLET(20 MEQ) BY MOUTH DAILY 30 tablet 9  . vortioxetine  HBr (TRINTELLIX) 20 MG TABS tablet Take 20 mg by mouth daily.      No current facility-administered medications for this visit.     Past Medical History:  Diagnosis Date  . Anxiety   . Cardiomyopathy, dilated, nonischemic (HCC) cardiolgoist-  dr Jeanni Allshouse   hx ef 20% per cardiac cath in 2003, last echo 01/ 2016  ef 45%  . Dyspnea on exertion   . GERD (gastroesophageal reflux disease)    per pt drinks water  . Hypercholesteremia   . Hypertension   . OSA on CPAP   . Pre-diabetes   . Productive cough    smoker  . Smokers' cough (HCC)   . Tobacco user   . Wears glasses     Past Surgical History:  Procedure Laterality Date  . APPENDECTOMY  1990s  . CARDIAC CATHETERIZATION  03-22-2001  dr Maylon Cos   severe LV dysfunction in a global matter;  normal coronaries, ef 20%  . COLONOSCOPY  last one 2016  . EVALUATION UNDER ANESTHESIA WITH HEMORRHOIDECTOMY N/A 01/02/2017   Procedure: ANORECTAL EXAM UNDER ANESTHESIA WITH HEMORRHOIDECTOMY AND HEMORRHOIDAL LIGATION/PEXY X2;  Surgeon: Karie Soda, MD;  Location: [facility];  Service: General;  Laterality: N/A;  GENERAL AND LOCAL  .  HEMORRHOID SURGERY  2008 approx.  Marland Kitchen MASS EXCISION Left 01/02/2017   Procedure: REMOVAL OF MASS ON LEFT UPPER GLUTEUS;  Surgeon: Karie Soda, MD;  Location: [facility];  Service: General;  Laterality: Left;  GENERAL AND LOCAL  . TONSILLECTOMY  child  . TRANSTHORACIC ECHOCARDIOGRAM  02-23-2014  dr Marquese Burkland   mild focal hypertrophy of the septum, ef 45-50%,  grade 1 diastolic dysfunction/  trivial MR and TR (no change when compared to last study 02-20-2010)  . UMBILICAL HERNIA REPAIR  1990s     ROS:  Positive for none.  Otherwise as stated in the HPI and negative for all other systems.   PHYSICAL EXAM BP (!) 140/98   Pulse 75   Ht 6\' 2"  (1.88 m)   Wt 236 lb (107 kg)   BMI 30.30 kg/m   GENERAL:  Well appearing NECK:  No jugular venous distention, waveform within normal  limits, carotid upstroke brisk and symmetric, no bruits, no thyromegaly LUNGS:  Clear to auscultation bilaterally CHEST:  Unremarkable HEART:  PMI not displaced or sustained,S1 and S2 within normal limits, no S3, no S4, no clicks, no rubs, no murmurs ABD:  Flat, positive bowel sounds normal in frequency in pitch, no bruits, no rebound, no guarding, no midline pulsatile mass, no hepatomegaly, no splenomegaly EXT:  2 plus pulses throughout, no edema, no cyanosis no clubbing   EKG:  Sinus rhythm, rate 75, axis within normal limits, left ventricular hypertrophy by voltage criteria,nonspecific ST-T wave changes.  03/31/2018  ASSESSMENT AND PLAN  Cardiomyopathy, dilated, nonischemic -  EF was mildly reduced last year.  And this was much improved compared to previous.  By exam I would not expect this to be different and do not think further echocardiography is indicated.  He will continue with his meds as listed.   Hypertension -  This is being managed in the context of treating his CHF.  No change in therapy.   Tobacco user -  We again discussed the need to stop smoking.  Did not tolerate Chantix in the past.  He will consider nicotine replacement therapy.  ETOH - He has been educated about the need to stop drinking.  He is able to give this up her periods at a time he feels better.  He understands the need to completely abstain and we did talk about this.   Depression - He is seeing a psychiatrist and having his meds actively managed.  He seems to be a little bit better though he is not where he would like to gain.  AAA - I will screen with an ultrasound given his smoking history and age.

## 2018-03-31 ENCOUNTER — Encounter: Payer: Self-pay | Admitting: Cardiology

## 2018-03-31 ENCOUNTER — Ambulatory Visit: Payer: BC Managed Care – PPO | Admitting: Cardiology

## 2018-03-31 ENCOUNTER — Encounter (INDEPENDENT_AMBULATORY_CARE_PROVIDER_SITE_OTHER): Payer: Self-pay

## 2018-03-31 VITALS — BP 140/98 | HR 75 | Ht 74.0 in | Wt 236.0 lb

## 2018-03-31 DIAGNOSIS — Z136 Encounter for screening for cardiovascular disorders: Secondary | ICD-10-CM | POA: Diagnosis not present

## 2018-03-31 DIAGNOSIS — I420 Dilated cardiomyopathy: Secondary | ICD-10-CM | POA: Diagnosis not present

## 2018-03-31 DIAGNOSIS — I10 Essential (primary) hypertension: Secondary | ICD-10-CM

## 2018-03-31 DIAGNOSIS — Z720 Tobacco use: Secondary | ICD-10-CM

## 2018-03-31 NOTE — Patient Instructions (Signed)
Medication Instructions:  Continue current medications  If you need a refill on your cardiac medications before your next appointment, please call your pharmacy.  Labwork: None Ordered   Take the provided lab slips with you to the lab for your blood draw.   When you have your labs (blood work) drawn today and your tests are completely normal, you will receive your results only by MyChart Message (if you have MyChart) -OR-  A paper copy in the mail.  If you have any lab test that is abnormal or we need to change your treatment, we will call you to review these results.  Testing/Procedures: Your physician has requested that you have an abdominal aorta duplex. During this test, an ultrasound is used to evaluate the aorta. Allow 30 minutes for this exam. Do not eat after midnight the day before and avoid carbonated beverages  Follow-Up: You will need a follow up appointment in 1 Year.  Please call our office 2 months in advance to schedule this appointment.  You may see Dr Antoine PocheHochrein or one of the following Advanced Practice Providers on your designated Care Team:   Theodore DemarkRhonda Barrett, PA-C . Joni ReiningKathryn Lawrence, DNP, ANP    At [facility], you and your health needs are our priority.  As part of our continuing mission to provide you with exceptional heart care, we have created designated Provider Care Teams.  These Care Teams include your primary Cardiologist (physician) and Advanced Practice Providers (APPs -  Physician Assistants and Nurse Practitioners) who all work together to provide you with the care you need, when you need it.  Thank you for choosing [facility] at [facility]!!

## 2018-04-06 ENCOUNTER — Other Ambulatory Visit: Payer: Self-pay | Admitting: Cardiology

## 2018-04-06 ENCOUNTER — Telehealth: Payer: Self-pay | Admitting: *Deleted

## 2018-04-06 ENCOUNTER — Ambulatory Visit (HOSPITAL_COMMUNITY)
Admission: RE | Admit: 2018-04-06 | Discharge: 2018-04-06 | Disposition: A | Discharge status: Home / Self Care | Payer: BC Managed Care – PPO | Source: Ambulatory Visit | Attending: Cardiovascular Disease | Admitting: Cardiovascular Disease

## 2018-04-06 DIAGNOSIS — Z136 Encounter for screening for cardiovascular disorders: Secondary | ICD-10-CM | POA: Insufficient documentation

## 2018-04-06 DIAGNOSIS — I700 Atherosclerosis of aorta: Secondary | ICD-10-CM | POA: Insufficient documentation

## 2018-04-06 NOTE — Telephone Encounter (Signed)
-----   Message from Rollene Rotunda, MD sent at 04/06/2018 11:02 AM EST ----- Small 3.1 cm AAA.  Needs follow up ultrasound in one year.  Call Mr. Kintigh with the results and send results to Blair Heys, MD

## 2018-04-06 NOTE — Telephone Encounter (Signed)
Patient notified of AAA ultrasound results and recommendations. Copy sent to Dr Manus Gunning via Epic.

## 2018-04-12 ENCOUNTER — Other Ambulatory Visit (HOSPITAL_COMMUNITY): Payer: BC Managed Care – PPO

## 2018-04-16 ENCOUNTER — Other Ambulatory Visit (HOSPITAL_COMMUNITY): Payer: BC Managed Care – PPO

## 2018-08-06 ENCOUNTER — Other Ambulatory Visit (HOSPITAL_COMMUNITY): Payer: Self-pay | Admitting: Cardiology

## 2018-08-06 DIAGNOSIS — I714 Abdominal aortic aneurysm, without rupture, unspecified: Secondary | ICD-10-CM

## 2019-04-13 NOTE — Progress Notes (Signed)
Cardiology Office Note   Date:  04/15/2019   ID:  Antonio Fuller, DOB Jan 18, 1951, MRN [id number]  PCP:  Antonio Arabian, MD  Cardiologist:   Minus Breeding, MD   Chief Complaint  Patient presents with  . Weight Gain      History of Present Illness: Antonio Fuller is a 69 y.o. 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  The etiology this was felt possibly to be related to alcohol.   His most recent echocardiogram in Feb 2019 demonstrated an EF of 45%. Several years ago it was as low as 15%.   He returns for follow up.    Since I last saw him he is gained weight.  Has gained about 25 pounds in 2 years.  He has a little increased abdominal girth.  He has some increased swelling in his legs.  He is not describing any new PND or orthopnea.  He does get some mildly increased dyspnea with exertion but this is not profoundly increased.  He has been limited in his activities because he has developed some left knee pain.  He also has plantar fasciitis.  He denies any chest pressure, neck or arm discomfort.  He said no new palpitations, presyncope or syncope  He unfortunately is still drinking every day sometimes up to 4 drinks per day.  Still smoking.  Past Medical History:  Diagnosis Date  . Anxiety   . Cardiomyopathy, dilated, nonischemic ([place]) cardiolgoist-  dr Arin Peral   hx ef 20% per cardiac cath in 2003, last echo 01/ 2016  ef 45%  . Dyspnea on exertion   . GERD (gastroesophageal reflux disease)    per pt drinks water  . Hypercholesteremia   . Hypertension   . OSA on CPAP   . Pre-diabetes   . Smokers' cough ([place])   . Tobacco user   . Wears glasses     Past Surgical History:  Procedure Laterality Date  . APPENDECTOMY  1990s  . CARDIAC CATHETERIZATION  03-22-2001  dr Vidal Schwalbe   severe LV dysfunction in a global matter;  normal coronaries, ef 20%  . COLONOSCOPY  last one 2016  . EVALUATION UNDER ANESTHESIA WITH HEMORRHOIDECTOMY N/A 01/02/2017   Procedure: ANORECTAL EXAM UNDER ANESTHESIA WITH HEMORRHOIDECTOMY AND HEMORRHOIDAL LIGATION/PEXY X2;  Surgeon: Michael Boston, MD;  Location: [place];  Service: General;  Laterality: N/A;  GENERAL AND LOCAL  . HEMORRHOID SURGERY  2008 approx.  Marland Kitchen MASS EXCISION Left 01/02/2017   Procedure: REMOVAL OF MASS ON LEFT UPPER GLUTEUS;  Surgeon: Michael Boston, MD;  Location: [place];  Service: General;  Laterality: Left;  GENERAL AND LOCAL  . TONSILLECTOMY  child  . TRANSTHORACIC ECHOCARDIOGRAM  02-23-2014  dr Detron Carras   mild focal hypertrophy of the septum, ef 45-50%,  grade 1 diastolic dysfunction/  trivial MR and TR (no change when compared to last study 02-20-2010)  . UMBILICAL HERNIA REPAIR  1990s     Current Outpatient Medications  Medication Sig Dispense Refill  . amLODipine (NORVASC) 5 MG tablet Take 1 tablet every morning by mouth.     Marland Kitchen aspirin 81 MG tablet Take 81 mg by mouth daily.      Marland Kitchen atorvastatin (LIPITOR) 20 MG tablet Take 1 tablet (20 mg total) by mouth daily. 30 tablet 11  . carvedilol (COREG) 25 MG tablet Take 1 tablet (25 mg total) by mouth 2 (two) times daily. 30 tablet 6  . ezetimibe (ZETIA) 10 MG tablet  TAKE 1 TABLET BY MOUTH ONCE DAILY 90 tablet 0  . fluticasone (FLONASE) 50 MCG/ACT nasal spray Place as needed into both nostrils.     Marland Kitchen lisinopril (PRINIVIL,ZESTRIL) 40 MG tablet Take 40 mg every morning by mouth.     . mirtazapine (REMERON) 45 MG tablet Take 45 mg by mouth at bedtime.    . Multiple Vitamin (MULTIVITAMIN) tablet Take 1 tablet by mouth daily.      . potassium chloride SA (K-DUR,KLOR-CON) 20 MEQ tablet TAKE 1 TABLET(20 MEQ) BY MOUTH DAILY 30 tablet 9  . TRINTELLIX 10 MG TABS tablet Take 10 mg by mouth daily.     No current facility-administered medications for this visit.    Allergies:   Patient has no known allergies.    ROS:  Please see the history of present illness.   Otherwise, review of systems are positive for  none.   All other systems are reviewed and negative.    PHYSICAL EXAM: VS:  BP 132/86   Pulse 76   Ht 6\' 2"  (1.88 m)   Wt 256 lb 6.4 oz (116.3 kg)   SpO2 96%   BMI 32.92 kg/m  , BMI Body mass index is 32.92 kg/m. GENERAL:  Well appearing NECK:  No jugular venous distention, waveform within normal limits, carotid upstroke brisk and symmetric, no bruits, no thyromegaly LUNGS:  Clear to auscultation bilaterally CHEST:  Unremarkable HEART:  PMI not displaced or sustained,S1 and S2 within normal limits, no S3, no S4, no clicks, no rubs, no murmurs ABD:  Flat, positive bowel sounds normal in frequency in pitch, no bruits, no rebound, no guarding, no midline pulsatile mass, no hepatomegaly, no splenomegaly EXT:  2 plus pulses throughout, trace bilateral leg edema, no cyanosis no clubbing   EKG:  EKG is ordered today. The ekg ordered today demonstrates sinus rhythm, rate 70, axis within normal limits, intervals within normal limits, poor anterior R wave progression.   Recent Labs: No results found for requested labs within last 8760 hours.    Lipid Panel No results found for: CHOL, TRIG, HDL, CHOLHDL, VLDL, LDLCALC, LDLDIRECT    Wt Readings from Last 3 Encounters:  04/15/19 256 lb 6.4 oz (116.3 kg)  03/31/18 236 lb (107 kg)  03/31/17 221 lb 12.8 oz (100.6 kg)      Other studies Reviewed: Additional studies/ records that were reviewed today include: None. Review of the above records demonstrates:  Please see elsewhere in the note.     ASSESSMENT AND PLAN:  Cardiomyopathy, dilated, nonischemic -  EF was mildly reduced in 2019.   Now he is having some increased edema and mild shortness of breath.  I do not strongly suspect decompensated heart failure.  I am going to check an echocardiogram.  For now he will continue the meds as listed.   Hypertension -  His blood pressure is at target and being managed in the context of treating his cardiomyopathy.   Tobacco user -  We have  talked about the need to stop smoking.  He did not tolerate Chantix in the past.  We talked about this again today.   ETOH - He understands need to stop drinking and he is to keep continuing to work on this.   Depression - He is followed by psychiatry and says that this is well managed now.   AAA - This was 3.1 CM last year.  He needs an follow up.  He will have a follow-up abdominal ultrasound.   Covid  education:   He has had both vaccine shots.   Current medicines are reviewed at length with the patient today.  The patient does not have concerns regarding medicines.  The following changes have been made:  no change  Labs/ tests ordered today include:   Orders Placed This Encounter  Procedures  . EKG 12-Lead  . ECHOCARDIOGRAM COMPLETE  . VAS [place] AAA DUPLEX     Disposition:   FU with me in one year.     Signed, Rollene Rotunda, MD  04/15/2019 9:49 AM    [facility]

## 2019-04-15 ENCOUNTER — Other Ambulatory Visit: Payer: Self-pay

## 2019-04-15 ENCOUNTER — Encounter (INDEPENDENT_AMBULATORY_CARE_PROVIDER_SITE_OTHER): Payer: Self-pay

## 2019-04-15 ENCOUNTER — Encounter: Payer: Self-pay | Admitting: Cardiology

## 2019-04-15 ENCOUNTER — Ambulatory Visit: Payer: BC Managed Care – PPO | Admitting: Cardiology

## 2019-04-15 VITALS — BP 132/86 | HR 76 | Ht 74.0 in | Wt 256.4 lb

## 2019-04-15 DIAGNOSIS — Z720 Tobacco use: Secondary | ICD-10-CM | POA: Diagnosis not present

## 2019-04-15 DIAGNOSIS — I714 Abdominal aortic aneurysm, without rupture, unspecified: Secondary | ICD-10-CM

## 2019-04-15 DIAGNOSIS — I10 Essential (primary) hypertension: Secondary | ICD-10-CM

## 2019-04-15 DIAGNOSIS — I420 Dilated cardiomyopathy: Secondary | ICD-10-CM | POA: Diagnosis not present

## 2019-04-15 NOTE — Patient Instructions (Addendum)
Medication Instructions:  No Changes *If you need a refill on your cardiac medications before your next appointment, please call your pharmacy*   Lab Work: None If you have labs (blood work) drawn today and your tests are completely normal, you will receive your results only by: Marland Kitchen MyChart Message (if you have MyChart) OR . A paper copy in the mail If you have any lab test that is abnormal or we need to change your treatment, we will call you to review the results.   Testing/Procedures: Your physician has requested that you have an abdominal aorta duplex. During this test, an ultrasound is used to evaluate the aorta. Allow 30 minutes for this exam. Do not eat after midnight the day before and avoid carbonated beverages  Your physician has requested that you have an echocardiogram. Echocardiography is a painless test that uses sound waves to create images of your heart. It provides your doctor with information about the size and shape of your heart and how well your heart's chambers and valves are working. This procedure takes approximately one hour. There are no restrictions for this procedure. [address]  Follow-Up: At [facility], you and your health needs are our priority.  As part of our continuing mission to provide you with exceptional heart care, we have created designated Provider Care Teams.  These Care Teams include your primary Cardiologist (physician) and Advanced Practice Providers (APPs -  Physician Assistants and Nurse Practitioners) who all work together to provide you with the care you need, when you need it.  We recommend signing up for the patient portal called "MyChart".  Sign up information is provided on this After Visit Summary.  MyChart is used to connect with patients for Virtual Visits (Telemedicine).  Patients are able to view lab/test results, encounter notes, upcoming appointments, etc.  Non-urgent messages can be sent to your provider as  well.   To learn more about what you can do with MyChart, go to [email or web address].    Your next appointment:   1 year(s)  The format for your next appointment:   In Person  Provider:   Rollene Rotunda, MD

## 2019-05-04 ENCOUNTER — Other Ambulatory Visit: Payer: Self-pay

## 2019-05-04 ENCOUNTER — Ambulatory Visit (HOSPITAL_COMMUNITY): Payer: BC Managed Care – PPO | Attending: Internal Medicine

## 2019-05-04 DIAGNOSIS — I420 Dilated cardiomyopathy: Secondary | ICD-10-CM | POA: Diagnosis present

## 2019-07-29 ENCOUNTER — Ambulatory Visit (HOSPITAL_COMMUNITY)
Admission: RE | Admit: 2019-07-29 | Discharge: 2019-07-29 | Disposition: A | Discharge status: Home / Self Care | Payer: BC Managed Care – PPO | Source: Ambulatory Visit | Attending: Cardiology | Admitting: Cardiology

## 2019-07-29 ENCOUNTER — Other Ambulatory Visit: Payer: Self-pay

## 2019-07-29 ENCOUNTER — Other Ambulatory Visit (HOSPITAL_COMMUNITY): Payer: Self-pay | Admitting: Cardiology

## 2019-07-29 DIAGNOSIS — I714 Abdominal aortic aneurysm, without rupture, unspecified: Secondary | ICD-10-CM

## 2019-12-28 ENCOUNTER — Ambulatory Visit: Payer: BC Managed Care – PPO | Admitting: Podiatry

## 2019-12-28 ENCOUNTER — Other Ambulatory Visit: Payer: Self-pay

## 2019-12-28 DIAGNOSIS — M79675 Pain in left toe(s): Secondary | ICD-10-CM

## 2019-12-28 DIAGNOSIS — B351 Tinea unguium: Secondary | ICD-10-CM

## 2019-12-28 DIAGNOSIS — M79674 Pain in right toe(s): Secondary | ICD-10-CM

## 2019-12-28 NOTE — Progress Notes (Signed)
   SUBJECTIVE Patient presents to office today complaining of elongated, thickened nails that cause pain while ambulating in shoes.  He is unable to trim his own nails to the bilateral great toes due to the thickening.. Patient is here for further evaluation and treatment.  Past Medical History:  Diagnosis Date  . Anxiety   . Cardiomyopathy, dilated, nonischemic (HCC) cardiolgoist-  dr hochrein   hx ef 20% per cardiac cath in 2003, last echo 01/ 2016  ef 45%  . Dyspnea on exertion   . GERD (gastroesophageal reflux disease)    per pt drinks water  . Hypercholesteremia   . Hypertension   . OSA on CPAP   . Pre-diabetes   . Smokers' cough (HCC)   . Tobacco user   . Wears glasses     OBJECTIVE General Patient is awake, alert, and oriented x 3 and in no acute distress. Derm Skin is dry and supple bilateral. Negative open lesions or macerations. Remaining integument unremarkable. Nails are tender, long, thickened and dystrophic with subungual debris, consistent with onychomycosis, bilateral great toes . No signs of infection noted. Vasc  DP and PT pedal pulses palpable bilaterally. Temperature gradient within normal limits.  Neuro Epicritic and protective threshold sensation grossly intact bilaterally.  Musculoskeletal Exam No symptomatic pedal deformities noted bilateral. Muscular strength within normal limits.  ASSESSMENT 1. Onychodystrophic nails bilateral great toes secondary to onychomycosis and trauma  PLAN OF CARE 1. Patient evaluated today.  2. Instructed to maintain good pedal hygiene and foot care.  3. Mechanical debridement of nails performed using a nail nipper. Filed with dremel without incident.  4.  Recommend OTC topical antifungal formula 3  5.  Return to clinic as needed   Felecia Shelling, DPM [facility]  Dr. Felecia Shelling, DPM    [address]                                        [place], [place] [address]                Office [phone]   Fax [phone]

## 2020-04-15 NOTE — Progress Notes (Signed)
Cardiology Office Note   Date:  04/16/2020   ID:  Antonio Fuller, DOB [birth year], MRN [id number]  PCP:  Blair Heys, MD  Cardiologist:   Rollene Rotunda, MD   Chief Complaint  Patient presents with  . Cardiomyopathy      History of Present Illness: Antonio Fuller is a 70 y.o. 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  The etiology this was felt possibly to be related to alcohol.    Several years ago it was as low as 15%.  His most recent ejection fraction was 55 to 60% on echo in March of last year.  He returns for follow up.    Since I last saw him he has done well.  He still working in his job in Teaching laboratory technician at [employer].  He is down to smoking about 8 cigarettes a day.  He drinks 1-2 drinks a day which used to be double that.  He tries to take the stairs at work.  Tries to do some walking but he has a sedentary job.  The patient denies any new symptoms such as chest discomfort, neck or arm discomfort. There has been no new shortness of breath, PND or orthopnea. There have been no reported palpitations, presyncope or syncope.   Past Medical History:  Diagnosis Date  . Anxiety   . Cardiomyopathy, dilated, nonischemic (HCC) cardiolgoist-  dr Denae Zulueta  . Dyspnea on exertion   . GERD (gastroesophageal reflux disease)    per pt drinks water  . Hypercholesteremia   . Hypertension   . OSA on CPAP   . Pre-diabetes   . Tobacco user     Past Surgical History:  Procedure Laterality Date  . APPENDECTOMY  1990s  . CARDIAC CATHETERIZATION  03-22-2001  dr Maylon Cos   severe LV dysfunction in a global matter;  normal coronaries, ef 20%  . COLONOSCOPY  last one 2016  . EVALUATION UNDER ANESTHESIA WITH HEMORRHOIDECTOMY N/A 01/02/2017   Procedure: ANORECTAL EXAM UNDER ANESTHESIA WITH HEMORRHOIDECTOMY AND HEMORRHOIDAL LIGATION/PEXY X2;  Surgeon: Karie Soda, MD;  Location: [facility];  Service: General;  Laterality: N/A;  GENERAL AND LOCAL  .  HEMORRHOID SURGERY  2008 approx.  Marland Kitchen MASS EXCISION Left 01/02/2017   Procedure: REMOVAL OF MASS ON LEFT UPPER GLUTEUS;  Surgeon: Karie Soda, MD;  Location: [facility];  Service: General;  Laterality: Left;  GENERAL AND LOCAL  . TONSILLECTOMY  child  . TRANSTHORACIC ECHOCARDIOGRAM  02-23-2014  dr Elver Stadler   mild focal hypertrophy of the septum, ef 45-50%,  grade 1 diastolic dysfunction/  trivial MR and TR (no change when compared to last study 02-20-2010)  . UMBILICAL HERNIA REPAIR  1990s     Current Outpatient Medications  Medication Sig Dispense Refill  . amLODipine (NORVASC) 5 MG tablet Take 1 tablet every morning by mouth.     Marland Kitchen aspirin 81 MG tablet Take 81 mg by mouth daily.    Marland Kitchen atorvastatin (LIPITOR) 20 MG tablet Take 1 tablet (20 mg total) by mouth daily. 30 tablet 11  . carvedilol (COREG) 25 MG tablet Take 1 tablet (25 mg total) by mouth 2 (two) times daily. 30 tablet 6  . Cholecalciferol (D-3-5 PO) Take by mouth.    . ezetimibe (ZETIA) 10 MG tablet TAKE 1 TABLET BY MOUTH ONCE DAILY 90 tablet 0  . fluticasone (FLONASE) 50 MCG/ACT nasal spray Place as needed into both nostrils.     Marland Kitchen lisinopril (PRINIVIL,ZESTRIL) 40  MG tablet Take 40 mg every morning by mouth.     . mirtazapine (REMERON) 45 MG tablet Take 45 mg by mouth at bedtime.    . Multiple Vitamin (MULTIVITAMIN) tablet Take 1 tablet by mouth daily.    . potassium chloride SA (K-DUR,KLOR-CON) 20 MEQ tablet TAKE 1 TABLET(20 MEQ) BY MOUTH DAILY 30 tablet 9  . TRINTELLIX 10 MG TABS tablet Take 10 mg by mouth daily.     No current facility-administered medications for this visit.    Allergies:   Patient has no known allergies.    ROS:  Please see the history of present illness.   Otherwise, review of systems are positive for none.   All other systems are reviewed and negative.    PHYSICAL EXAM: VS:  BP 130/80   Pulse 72   Ht 6\' 2"  (1.88 m)   Wt 246 lb (111.6 kg)   SpO2 97%   BMI 31.58 kg/m  , BMI  Body mass index is 31.58 kg/m. GENERAL:  Well appearing NECK:  No jugular venous distention, waveform within normal limits, carotid upstroke brisk and symmetric, no bruits, no thyromegaly LUNGS:  Clear to auscultation bilaterally CHEST:  Unremarkable HEART:  PMI not displaced or sustained,S1 and S2 within normal limits, no S3, no S4, no clicks, no rubs, no murmurs ABD:  Flat, positive bowel sounds normal in frequency in pitch, no bruits, no rebound, no guarding, no midline pulsatile mass, no hepatomegaly, no splenomegaly EXT:  2 plus pulses throughout, no edema, no cyanosis no clubbing   EKG:  EKG is ordered today. The ekg ordered today demonstrates sinus rhythm, rate 72 axis within normal limits, intervals within normal limits, poor anterior R wave progression.   Recent Labs: No results found for requested labs within last 8760 hours.    Lipid Panel No results found for: CHOL, TRIG, HDL, CHOLHDL, VLDL, LDLCALC, LDLDIRECT    Wt Readings from Last 3 Encounters:  04/16/20 246 lb (111.6 kg)  04/15/19 256 lb 6.4 oz (116.3 kg)  03/31/18 236 lb (107 kg)      Other studies Reviewed: Additional studies/ records that were reviewed today include: Labs. Review of the above records demonstrates:  See elsewhere   ASSESSMENT AND PLAN:  Cardiomyopathy, dilated, nonischemic -  EF was normal on 2021 with a possible PFO.      Hypertension -  His blood pressure is at target.  No change in therapy.   Tobacco user -  We again talked about the need for complete abstinence.  He did not tolerate Chantix in the past.   ETOH - He understands and is aiming towards abstinence.  Depression - He is under therapy for this.  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    Current medicines are reviewed at length with the patient today.  The patient does not have concerns regarding medicines.  The following changes have been made:  no change  Labs/ tests ordered  today include:    Orders Placed This Encounter  Procedures  . EKG 12-Lead     Disposition:   FU with me in one year.    Signed, 2022, MD  04/16/2020 12:30 PM    [facility]

## 2020-04-16 ENCOUNTER — Other Ambulatory Visit: Payer: Self-pay

## 2020-04-16 ENCOUNTER — Ambulatory Visit: Payer: BC Managed Care – PPO | Admitting: Cardiology

## 2020-04-16 ENCOUNTER — Encounter: Payer: Self-pay | Admitting: Cardiology

## 2020-04-16 VITALS — BP 130/80 | HR 72 | Ht 74.0 in | Wt 246.0 lb

## 2020-04-16 DIAGNOSIS — I420 Dilated cardiomyopathy: Secondary | ICD-10-CM | POA: Diagnosis not present

## 2020-04-16 DIAGNOSIS — I10 Essential (primary) hypertension: Secondary | ICD-10-CM

## 2020-04-16 NOTE — Patient Instructions (Signed)
Medication Instructions:  No changes *If you need a refill on your cardiac medications before your next appointment, please call your pharmacy*  Follow-Up: At [facility], you and your health needs are our priority.  As part of our continuing mission to provide you with exceptional heart care, we have created designated Provider Care Teams.  These Care Teams include your primary Cardiologist (physician) and Advanced Practice Providers (APPs -  Physician Assistants and Nurse Practitioners) who all work together to provide you with the care you need, when you need it.  Your next appointment:   12 month(s) You will receive a reminder letter in the mail two months in advance. If you don't receive a letter, please call our office to schedule the follow-up appointment.  The format for your next appointment:   In Person  Provider:   James Hochrein, MD   

## 2020-07-26 ENCOUNTER — Other Ambulatory Visit (HOSPITAL_COMMUNITY): Payer: Self-pay | Admitting: Cardiology

## 2020-07-26 DIAGNOSIS — I714 Abdominal aortic aneurysm, without rupture, unspecified: Secondary | ICD-10-CM

## 2020-07-30 ENCOUNTER — Other Ambulatory Visit: Payer: Self-pay

## 2020-07-30 ENCOUNTER — Ambulatory Visit (HOSPITAL_COMMUNITY)
Admission: RE | Admit: 2020-07-30 | Discharge: 2020-07-30 | Disposition: A | Discharge status: Home / Self Care | Payer: BC Managed Care – PPO | Source: Ambulatory Visit | Attending: Cardiology | Admitting: Cardiology

## 2020-07-30 DIAGNOSIS — I714 Abdominal aortic aneurysm, without rupture, unspecified: Secondary | ICD-10-CM

## 2020-09-07 ENCOUNTER — Other Ambulatory Visit: Payer: Self-pay

## 2020-09-07 ENCOUNTER — Other Ambulatory Visit: Payer: Self-pay | Admitting: Internal Medicine

## 2020-09-07 ENCOUNTER — Ambulatory Visit
Admission: RE | Admit: 2020-09-07 | Discharge: 2020-09-07 | Disposition: A | Discharge status: Home / Self Care | Payer: BC Managed Care – PPO | Source: Ambulatory Visit | Attending: Internal Medicine | Admitting: Internal Medicine

## 2020-09-07 DIAGNOSIS — J208 Acute bronchitis due to other specified organisms: Secondary | ICD-10-CM

## 2020-12-28 ENCOUNTER — Other Ambulatory Visit: Payer: Self-pay

## 2020-12-28 ENCOUNTER — Ambulatory Visit: Payer: BC Managed Care – PPO | Admitting: Pulmonary Disease

## 2020-12-28 ENCOUNTER — Encounter: Payer: Self-pay | Admitting: Pulmonary Disease

## 2020-12-28 VITALS — BP 124/72 | HR 70 | Ht 74.0 in | Wt 244.4 lb

## 2020-12-28 DIAGNOSIS — R053 Chronic cough: Secondary | ICD-10-CM | POA: Diagnosis not present

## 2020-12-28 DIAGNOSIS — J398 Other specified diseases of upper respiratory tract: Secondary | ICD-10-CM | POA: Diagnosis not present

## 2020-12-28 DIAGNOSIS — Z720 Tobacco use: Secondary | ICD-10-CM

## 2020-12-28 MED ORDER — ALBUTEROL SULFATE HFA 108 (90 BASE) MCG/ACT IN AERS
2.0000 | INHALATION_SPRAY | Freq: Four times a day (QID) | RESPIRATORY_TRACT | 2 refills | Status: DC | PRN
Start: 2020-12-28 — End: 2021-03-15

## 2020-12-28 MED ORDER — FLUTICASONE FUROATE-VILANTEROL 200-25 MCG/ACT IN AEPB: 1.0000 | INHALATION_SPRAY | Freq: Every day | RESPIRATORY_TRACT | 6 refills | Status: DC

## 2020-12-28 NOTE — Patient Instructions (Addendum)
Start Breo Ellipta 1 puff daily  - rinse mouth out after each use  Use albuterol inhaler 1-2 puffs every 4-6 hours as needed for cough, chest tightness, wheezing or shortness of breath.  We will refer you to our lung cancer screening program   Recommend cutting back further on cigarette use.

## 2020-12-28 NOTE — Progress Notes (Signed)
Synopsis: Referred in November 2022 for Cough by Blair Heys, MD  Subjective:   PATIENT ID: Antonio Fuller Shepler GENDER: male DOB: Feb 01, 1951, MRN: [id number]   HPI  Chief Complaint  Patient presents with   Consult    Referred by PCP for chronic cough and chest pressure since May 2022. Productive cough. Was diagnosed with bronchitis back in August.     Antonio Fuller is a 70 year old male, daily smoker with GERD, OSA on CPAP, hypertension and cardiomyopathy who is referred to pulmonary clinic for chronic cough.   Patient reports increased cough, mucus production and intermittent wheezing since May of this year.  He was treated with doxycycline in July for acute bronchitis with improvement of his symptoms but no resolution.  He does report exertional dyspnea with 2 or more flights of stairs.  He denies seasonal allergies.  He denies any sinus congestion or postnasal drainage.  He had a deviated septum repaired in the past.  He denies any significant heartburn symptoms.  He currently works as a Comptroller at [employer], he has been married for 35 years.  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  He does report that his voice has become deeper over time.  He has been on lisinopril for many years.   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   Dyspnea on exertion    GERD (gastroesophageal reflux disease)    per pt drinks water   Hypercholesteremia    Hypertension    OSA on CPAP    Pre-diabetes    Tobacco user      Family History  Problem Relation Age of Onset   Hypertension Mother    Hypertension Father      Social History   Socioeconomic History   Marital status: Married    Spouse name: Not on file   Number of children: Not on file   Years of  education: Not on file   Highest education level: Not on file  Occupational History   Occupation: Librarian    Employer: [employer]  Tobacco Use   Smoking status: Every Day    Packs/day: 0.50    Years: 40.00    Pack years: 20.00    Types: Cigarettes   Smokeless tobacco: Never  Vaping Use   Vaping Use: Some days  Substance and Sexual Activity   Alcohol use: Yes    Alcohol/week: 35.0 standard drinks    Types: 35 Standard drinks or equivalent per week    Comment: 3-5 drinks per day   Drug use: No   Sexual activity: Not on file  Other Topics Concern   Not on file  Social History Narrative   Not on file   Social Determinants of Health   Financial Resource Strain: Not on file  Food Insecurity: Not on file  Transportation Needs: Not on file  Physical Activity: Not on file  Stress: Not on file  Social Connections: Not on file  Intimate Partner Violence: Not on file     Allergies  Allergen Reactions   Wellbutrin [Bupropion] Other (See Comments)    Depression increased     Outpatient Medications Prior to Visit  Medication Sig Dispense Refill   amLODipine (NORVASC) 5 MG tablet Take 1 tablet  every morning by mouth.      aspirin 81 MG tablet Take 81 mg by mouth daily.     atorvastatin (LIPITOR) 20 MG tablet Take 1 tablet (20 mg total) by mouth daily. 30 tablet 11   carvedilol (COREG) 25 MG tablet Take 1 tablet (25 mg total) by mouth 2 (two) times daily. 30 tablet 6   Cholecalciferol (D-3-5 PO) Take by mouth.     ezetimibe (ZETIA) 10 MG tablet TAKE 1 TABLET BY MOUTH ONCE DAILY 90 tablet 0   fluticasone (FLONASE) 50 MCG/ACT nasal spray Place as needed into both nostrils.      L-Methylfolate 15 MG TABS Take 15 mg by mouth daily.     lisinopril (PRINIVIL,ZESTRIL) 40 MG tablet Take 40 mg every morning by mouth.      mirtazapine (REMERON) 30 MG tablet Take 30 mg by mouth at bedtime.     Multiple Vitamin (MULTIVITAMIN) tablet Take 1 tablet by mouth daily.     Potassium  Chloride ER 20 MEQ TBCR Take 1 tablet by mouth daily.     vortioxetine HBr (TRINTELLIX) 10 MG TABS tablet Take 10 mg by mouth daily.     mirtazapine (REMERON) 45 MG tablet Take 45 mg by mouth at bedtime.     potassium chloride SA (K-DUR,KLOR-CON) 20 MEQ tablet TAKE 1 TABLET(20 MEQ) BY MOUTH DAILY 30 tablet 9   TRINTELLIX 10 MG TABS tablet Take 10 mg by mouth daily.     No facility-administered medications prior to visit.    Review of Systems  Constitutional:  Negative for chills, fever, malaise/fatigue and weight loss.  HENT:  Negative for congestion, sinus pain and sore throat.   Eyes: Negative.   Respiratory:  Positive for cough, sputum production, shortness of breath and wheezing. Negative for hemoptysis.   Cardiovascular:  Positive for chest pain. Negative for palpitations, orthopnea, claudication and leg swelling.  Gastrointestinal:  Positive for heartburn. Negative for abdominal pain, nausea and vomiting.  Genitourinary: Negative.   Musculoskeletal:  Negative for joint pain and myalgias.  Skin:  Negative for rash.  Neurological:  Negative for weakness.  Endo/Heme/Allergies: Negative.   Psychiatric/Behavioral: Negative.       Objective:   Vitals:   12/28/20 1426  BP: 124/72  Pulse: 70  SpO2: 95%  Weight: 244 lb 6.4 oz (110.9 kg)  Height: 6\' 2"  (1.88 m)     Physical Exam Constitutional:      General: He is not in acute distress. HENT:     Head: Normocephalic and atraumatic.  Eyes:     Extraocular Movements: Extraocular movements intact.     Conjunctiva/sclera: Conjunctivae normal.     Pupils: Pupils are equal, round, and reactive to light.  Cardiovascular:     Rate and Rhythm: Normal rate and regular rhythm.     Pulses: Normal pulses.     Heart sounds: Normal heart sounds. No murmur heard. Pulmonary:     Effort: Pulmonary effort is normal.     Breath sounds: Decreased breath sounds present. No wheezing, rhonchi or rales.  Abdominal:     General: Bowel sounds  are normal.     Palpations: Abdomen is soft.  Musculoskeletal:     Right lower leg: No edema.     Left lower leg: No edema.  Lymphadenopathy:     Cervical: No cervical adenopathy.  Skin:    General: Skin is warm and dry.  Neurological:     General: No focal deficit present.     Mental  Status: He is alert.  Psychiatric:        Mood and Affect: Mood normal.        Behavior: Behavior normal.        Thought Content: Thought content normal.        Judgment: Judgment normal.   CBC    Component Value Date/Time   HGB 15.3 01/02/2017 0713   HCT 45.0 01/02/2017 0713     Chest imaging: CXR 09/07/2020 Lung volumes are normal. Mild diffuse peribronchial cuffing. No consolidative airspace disease. No pleural effusions. No pneumothorax. No pulmonary nodule or mass noted. Pulmonary vasculature and the cardiomediastinal silhouette are within normal limits. Atherosclerosis in the thoracic aorta.  PFT: No flowsheet data found.  Labs:  Path:  Echo 05/04/2019: LVEF 55-60%. Grade I diastolic dysfunction. RV size is normal.   Heart Catheterization:  Assessment & Plan:   Chronic cough - Plan: Pulmonary Function Test, fluticasone furoate-vilanterol (BREO ELLIPTA) 200-25 MCG/ACT AEPB, albuterol (VENTOLIN HFA) 108 (90 Base) MCG/ACT inhaler  Tobacco use - Plan: Ambulatory Referral for Lung Cancer Scre  Tracheal deviation  Discussion: Antonio Fuller is a 70 year old male, daily smoker with GERD, OSA on CPAP, hypertension and cardiomyopathy who is referred to pulmonary clinic for chronic cough.   Patient's chronic cough is likely secondary to COPD with possible chronic bronchitis variant.  He has significant smoking history which we will refer him to our lung cancer screening program.  He continues to smoke 8 cigarettes daily which we discussed cutting back even further with the ultimate goal of quitting smoking altogether.  We reviewed his chest x-ray which was concerning for bronchitis and  also deviation of his trachea to the right compared to prior chest radiograph.  He also notes that he has had changes in his voice over the years as it appears deeper than before.  My concern is for a mediastinal mass that is leading to the tracheal deviation and voice changes.  We will schedule patient for pulmonary function tests in the next week or 2.  He is to start Breo Ellipta 1 puff daily.  He can start as needed albuterol.  Follow up in 2 months.   Antonio Comas, MD [facility] Office: [phone]   Current Outpatient Medications:    albuterol (VENTOLIN HFA) 108 (90 Base) MCG/ACT inhaler, Inhale 2 puffs into the lungs every 6 (six) hours as needed for wheezing or shortness of breath., Disp: 8 g, Rfl: 2   amLODipine (NORVASC) 5 MG tablet, Take 1 tablet every morning by mouth. , Disp: , Rfl:    aspirin 81 MG tablet, Take 81 mg by mouth daily., Disp: , Rfl:    atorvastatin (LIPITOR) 20 MG tablet, Take 1 tablet (20 mg total) by mouth daily., Disp: 30 tablet, Rfl: 11   carvedilol (COREG) 25 MG tablet, Take 1 tablet (25 mg total) by mouth 2 (two) times daily., Disp: 30 tablet, Rfl: 6   Cholecalciferol (D-3-5 PO), Take by mouth., Disp: , Rfl:    ezetimibe (ZETIA) 10 MG tablet, TAKE 1 TABLET BY MOUTH ONCE DAILY, Disp: 90 tablet, Rfl: 0   fluticasone (FLONASE) 50 MCG/ACT nasal spray, Place as needed into both nostrils. , Disp: , Rfl:    fluticasone furoate-vilanterol (BREO ELLIPTA) 200-25 MCG/ACT AEPB, Inhale 1 puff into the lungs daily., Disp: 28 each, Rfl: 6   L-Methylfolate 15 MG TABS, Take 15 mg by mouth daily., Disp: , Rfl:    lisinopril (PRINIVIL,ZESTRIL) 40 MG tablet, Take 40  mg every morning by mouth. , Disp: , Rfl:    mirtazapine (REMERON) 30 MG tablet, Take 30 mg by mouth at bedtime., Disp: , Rfl:    Multiple Vitamin (MULTIVITAMIN) tablet, Take 1 tablet by mouth daily., Disp: , Rfl:    Potassium Chloride ER 20 MEQ TBCR, Take 1 tablet by mouth daily., Disp: ,  Rfl:    vortioxetine HBr (TRINTELLIX) 10 MG TABS tablet, Take 10 mg by mouth daily., Disp: , Rfl:

## 2021-01-07 ENCOUNTER — Telehealth: Payer: Self-pay | Admitting: Pulmonary Disease

## 2021-01-07 DIAGNOSIS — Z87891 Personal history of nicotine dependence: Secondary | ICD-10-CM

## 2021-01-07 DIAGNOSIS — F17210 Nicotine dependence, cigarettes, uncomplicated: Secondary | ICD-10-CM

## 2021-01-08 NOTE — Telephone Encounter (Signed)
JD please advise. Thanks  

## 2021-01-09 NOTE — Telephone Encounter (Signed)
Bevelyn Ngo, NP  Jenna Luo, RN; Martina Sinner, MD; [facility] Triage Pool 14 minutes ago (8:50 AM)   Since the patient needs a SDMV for the CT Chest to be done under the program, I am not sure if we can co-ordinate the scans. Angelique Blonder, see if this is possible. If not,and  if they need to be done at the same time , perhaps Dr. Francine Graven can order CT Chest without and patient can start with the screening program next year. Thanks    Martina Sinner, MD  Marcellus Scott, LPN; [facility] Triage Pool; Bevelyn Ngo, NP 15 hours ago (5:46 PM)   He has been referred to lung cancer screening program. If a CT Chest LCS can be ordered and done at same time, that would be fine.      Attempted to call pt to relay info from both JD and SG but unable to reach. Left message for him to return call.

## 2021-01-09 NOTE — Telephone Encounter (Signed)
Spoke with pt regarding lung cancer screening. Pt had a CT scan of his kidneys done today at [facility]. I explained to pt that we would not have been able to do the lung screening CT along with the kidney CT because [facility] is separate from [place]. Pt verbalized understanding.  Spoke with patient Scheduled SDMV 01/30/21 9:00 CT order was placed Pt voiced understanding and had no further questions.

## 2021-01-09 NOTE — Telephone Encounter (Signed)
SDMV is shared decision making visit which is the type of visit that pts have for the lung cancer screening.

## 2021-01-15 ENCOUNTER — Telehealth: Payer: Self-pay | Admitting: Cardiology

## 2021-01-15 ENCOUNTER — Telehealth: Payer: Self-pay | Admitting: Pulmonary Disease

## 2021-01-15 NOTE — Telephone Encounter (Signed)
Spoke with pt who states that after each use of his Breo inhaler he has a rapid heart beat but it goes away after a minute. Pt will continue to use and call office with any concerns.

## 2021-01-15 NOTE — Telephone Encounter (Signed)
Call returned to patient, confirmed DOB. Patient states since starting his Breo inhaler his heart has been racing. He states he spoke with his cardiologist and they told him that this is a known side effect of Breo and that he is okay but if it persists to be sure to contact us. Voiced understanding.   Will route to provider as FYI.   Nothing further needed at this time.

## 2021-01-15 NOTE — Telephone Encounter (Signed)
Pt c/o medication issue:  1. Name of Medication: fluticasone furoate-vilanterol (BREO ELLIPTA) 200-25 MCG/ACT AEPB  2. How are you currently taking this medication (dosage and times per day)? Inhale 1 puff into the lungs daily.  3. Are you having a reaction (difficulty breathing--STAT)? no  4. What is your medication issue? heart felt like it was racing after taking this.. pt would like to know if this should be a concern... please advise

## 2021-01-29 NOTE — Progress Notes (Signed)
Virtual Visit via Telephone Note  I connected with Aymen Widrig Foell on 01/30/21 at  9:00 AM EST by telephone and verified that I am speaking with the correct person using two identifiers.  Location: Patient: Home Provider: Office   I discussed the limitations, risks, security and privacy concerns of performing an evaluation and management service by telephone and the availability of in person appointments. I also discussed with the patient that there may be a patient responsible charge related to this service. The patient expressed understanding and agreed to proceed.   Shared Decision Making Visit Lung Cancer Screening Program [phone])   Eligibility: Age 70 y.o. Pack Years Smoking History Calculation 48 (# packs/per year x # years smoked) Recent History of coughing up blood  no Unexplained weight loss? no ( >Than 15 pounds within the last 6 months ) Prior History Lung / other cancer no (Diagnosis within the last 5 years already requiring surveillance chest CT Scans). Smoking Status Current Smoker Former Smokers: Years since quit: < 1 year  Quit Date: NA  Visit Components: Discussion included one or more decision making aids. yes Discussion included risk/benefits of screening. yes Discussion included potential follow up diagnostic testing for abnormal scans. yes Discussion included meaning and risk of over diagnosis. yes Discussion included meaning and risk of False Positives. yes Discussion included meaning of total radiation exposure. yes  Counseling Included: Importance of adherence to annual lung cancer LDCT screening. yes Impact of comorbidities on ability to participate in the program. yes Ability and willingness to under diagnostic treatment. yes  Smoking Cessation Counseling: Current Smokers:  Discussed importance of smoking cessation. yes Information about tobacco cessation classes and interventions provided to patient. yes Patient provided with "ticket" for LDCT  Scan. NA- virtual visit  Symptomatic Patient. no  Counseling(Intermediate counseling: > three minutes) 99406 Diagnosis Code: Tobacco Use Z72.0 Asymptomatic Patient yes  Counseling (Intermediate counseling: > three minutes counseling) C6237 Former Smokers:  Discussed the importance of maintaining cigarette abstinence. yes Diagnosis Code: Personal History of Nicotine Dependence. S28.315 Information about tobacco cessation classes and interventions provided to patient. Yes Patient provided with "ticket" for LDCT Scan. Na Written Order for Lung Cancer Screening with LDCT placed in Epic. Yes (CT Chest Lung Cancer Screening Low Dose W/O CM) [id number] Z12.2-Screening of respiratory organs Z87.891-Personal history of nicotine dependence  I have spent 25 minutes of face to face/ virtual visit   time with Mr Giambra discussing the risks and benefits of lung cancer screening. We viewed / discussed a power point together that explained in detail the above noted topics. We paused at intervals to allow for questions to be asked and answered to ensure understanding.We discussed that the single most powerful action that he can take to decrease his risk of developing lung cancer is to quit smoking. We discussed whether or not he is ready to commit to setting a quit date. We discussed options for tools to aid in quitting smoking including nicotine replacement therapy, non-nicotine medications, support groups, Quit Smart classes, and behavior modification. We discussed that often times setting smaller, more achievable goals, such as eliminating 1 cigarette a day for a week and then 2 cigarettes a day for a week can be helpful in slowly decreasing the number of cigarettes smoked. This allows for a sense of accomplishment as well as providing a clinical benefit. I provided him  with smoking cessation  information  with contact information for community resources, classes, free nicotine replacement therapy, and access to  mobile  apps, text messaging, and on-line smoking cessation help. I have also provided him  the office contact information in the event he needs to contact me, or the screening staff. We discussed the time and location of the scan, and that either Abigail Miyamoto RN, Karlton Lemon, RN  or I will call / send a letter with the results within 24-72 hours of receiving them. The patient verbalized understanding of all of  the above and had no further questions upon leaving the office. They have my contact information in the event they have any further questions.  I spent 3-5 minutes counseling on smoking cessation and the health risks of continued tobacco abuse.  I explained to the patient that there has been a high incidence of coronary artery disease noted on these exams. I explained that this is a non-gated exam therefore degree or severity cannot be determined. This patient is on statin therapy. I have asked the patient to follow-up with their PCP regarding any incidental finding of coronary artery disease and management with diet or medication as their PCP  feels is clinically indicated. The patient verbalized understanding of the above and had no further questions upon completion of the visit.      Glenford Bayley, NP

## 2021-01-29 NOTE — Patient Instructions (Signed)
Thank you for participating in the [facility] Scre[facility]. °It was our pleasure to meet you today. °We will call you with the results of your scan within the next few days. °Your scan will be assigned a Lung RADS category score by the physicians reading the scans.  °This Lung RADS score determines follow up scanning.  °See below for description of categories, and follow up screening recommendations. °We will be in touch to schedule your follow up screening annually or based on recommendations of our providers. °We will fax a copy of your scan results to your Primary Care Physician, or the physician who referred you to the program, to ensure they have the results. °Please call the office if you have any questions or concerns regarding your scanning experience or results.  °Our office number is [phone]. °Please speak with Denise Phelps, RN. She is our Lung Cancer Screening RN. °If she is unavailable when you call, please have the office staff send her a message. She will return your call at her earliest convenience. °Remember, if your scan is normal, we will scan you annually as long as you continue to meet the criteria for the program. (Age 55-77, Current smoker or smoker who has quit within the last 15 years). °If you are a smoker, remember, quitting is the single most powerful action that you can take to decrease your risk of lung cancer and other pulmonary, breathing related problems. °We know quitting is hard, and we are here to help.  °Please let us know if there is anything we can do to help you meet your goal of quitting. °If you are a former smoker, congratulations. We are proud of you! Remain smoke free! °Remember you can refer friends or family members through the number above.  °We will screen them to make sure they meet criteria for the program. °Thank you for helping us take better care of you by participating in Lung Screening. ° °You can receive free nicotine replacement therapy  ( patches, gum or mints) by calling 1-800-QUIT NOW. Please call so we can get you on the path to becoming  a non-smoker. I know it is hard, but you can do this! ° °Lung RADS Categories: ° °Lung RADS 1: no nodules or definitely non-concerning nodules.  °Recommendation is for a repeat annual scan in 12 months. ° °Lung RADS 2:  nodules that are non-concerning in appearance and behavior with a very low likelihood of becoming an active cancer. °Recommendation is for a repeat annual scan in 12 months. ° °Lung RADS 3: nodules that are probably non-concerning , includes nodules with a low likelihood of becoming an active cancer.  Recommendation is for a 6-month repeat screening scan. Often noted after an upper respiratory illness. We will be in touch to make sure you have no questions, and to schedule your 6-month scan. ° °Lung RADS 4 A: nodules with concerning findings, recommendation is most often for a follow up scan in 3 months or additional testing based on our provider's assessment of the scan. We will be in touch to make sure you have no questions and to schedule the recommended 3 month follow up scan. ° °Lung RADS 4 B:  indicates findings that are concerning. We will be in touch with you to schedule additional diagnostic testing based on our provider's  assessment of the scan. ° °Hypnosis for smoking cessation  °[employer] °[phone] ° °Acupuncture for smoking cessation  °[facility] °[phone]  °

## 2021-01-30 ENCOUNTER — Ambulatory Visit (INDEPENDENT_AMBULATORY_CARE_PROVIDER_SITE_OTHER): Payer: BC Managed Care – PPO | Admitting: Primary Care

## 2021-01-30 ENCOUNTER — Other Ambulatory Visit: Payer: Self-pay

## 2021-01-30 ENCOUNTER — Encounter: Payer: Self-pay | Admitting: Primary Care

## 2021-01-30 DIAGNOSIS — F17200 Nicotine dependence, unspecified, uncomplicated: Secondary | ICD-10-CM

## 2021-01-30 DIAGNOSIS — F17210 Nicotine dependence, cigarettes, uncomplicated: Secondary | ICD-10-CM | POA: Diagnosis not present

## 2021-01-31 ENCOUNTER — Ambulatory Visit (INDEPENDENT_AMBULATORY_CARE_PROVIDER_SITE_OTHER)
Admission: RE | Admit: 2021-01-31 | Discharge: 2021-01-31 | Disposition: A | Discharge status: Home / Self Care | Payer: BC Managed Care – PPO | Source: Ambulatory Visit

## 2021-01-31 DIAGNOSIS — I700 Atherosclerosis of aorta: Secondary | ICD-10-CM | POA: Diagnosis not present

## 2021-01-31 DIAGNOSIS — J439 Emphysema, unspecified: Secondary | ICD-10-CM

## 2021-01-31 DIAGNOSIS — F17210 Nicotine dependence, cigarettes, uncomplicated: Secondary | ICD-10-CM

## 2021-01-31 DIAGNOSIS — Z87891 Personal history of nicotine dependence: Secondary | ICD-10-CM

## 2021-02-07 ENCOUNTER — Other Ambulatory Visit: Payer: Self-pay | Admitting: Acute Care

## 2021-02-07 DIAGNOSIS — Z87891 Personal history of nicotine dependence: Secondary | ICD-10-CM

## 2021-02-07 DIAGNOSIS — F17210 Nicotine dependence, cigarettes, uncomplicated: Secondary | ICD-10-CM

## 2021-03-13 ENCOUNTER — Ambulatory Visit: Payer: BC Managed Care – PPO | Admitting: Pulmonary Disease

## 2021-03-13 ENCOUNTER — Encounter: Payer: Self-pay | Admitting: Pulmonary Disease

## 2021-03-13 ENCOUNTER — Other Ambulatory Visit: Payer: Self-pay

## 2021-03-13 VITALS — BP 118/74 | HR 66 | Ht 74.0 in | Wt 249.5 lb

## 2021-03-13 DIAGNOSIS — J432 Centrilobular emphysema: Secondary | ICD-10-CM

## 2021-03-13 DIAGNOSIS — F17200 Nicotine dependence, unspecified, uncomplicated: Secondary | ICD-10-CM | POA: Diagnosis not present

## 2021-03-13 NOTE — Progress Notes (Signed)
Synopsis: Referred in November 2022 for Cough by Blair Heys, MD  Subjective:   PATIENT ID: Antonio Fuller GENDER: male DOB: Feb 09, 1951, MRN: [id number]  Breo caused heart racing - got better now - good days and bad days    HPI  Chief Complaint  Patient presents with   Follow-up    2 mo f/u for cough. States he has been doing well since last visit. Still using Breo.     Antonio Fuller is a 71 year old male, daily smoker with GERD, OSA on CPAP, hypertension and cardiomyopathy who returns to pulmonary clinic for chronic cough.   He is tolerating the Breo Ellipta 100-25 MCG 1 puff daily better at this time.  He initially noted some heart racing but has not noticed it after continuing this medication.  He has good days and bad days with his breathing but overall he feels the cough is much better since taking the [facility].  Lung cancer screening CT chest 01/2021 showed mild centrilobular emphysema and a 2 mm right upper lobe nodule.  He is smoking 8 cigarettes daily at this time.  OV 12/28/2020 Patient reports increased cough, mucus production and intermittent wheezing since May of this year.  He was treated with doxycycline in July for acute bronchitis with improvement of his symptoms but no resolution.  He does report exertional dyspnea with 2 or more flights of stairs.  He denies seasonal allergies.  He denies any sinus congestion or postnasal drainage.  He had a deviated septum repaired in the past.  He denies any significant heartburn symptoms.  He currently works as a Comptroller at [employer], he has been married for 35 years.  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  He does report that his voice has become deeper over time.  He has been on lisinopril for many years.   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   Dyspnea on exertion    GERD (gastroesophageal reflux disease)    per pt drinks water   Hypercholesteremia    Hypertension    OSA on CPAP    Pre-diabetes    Tobacco user      Family History  Problem Relation Age of Onset   Hypertension Mother    Hypertension Father      Social History   Socioeconomic History   Marital status: Married    Spouse name: Not on file   Number of children: Not on file   Years of education: Not on file   Highest education level: Not on file  Occupational History   Occupation: Librarian    Employer: [employer]  Tobacco Use   Smoking status: Every Day    Packs/day: 0.50    Years: 40.00    Pack years: 20.00    Types: Cigarettes   Smokeless tobacco: Never  Vaping Use   Vaping Use: Some days  Substance and Sexual Activity   Alcohol use: Yes    Alcohol/week: 35.0 standard drinks    Types: 35 Standard drinks or equivalent per week    Comment: 3-5 drinks per day   Drug use: No   Sexual activity: Not on file  Other Topics Concern   Not on file  Social History Narrative  Not on file   Social Determinants of Health   Financial Resource Strain: Not on file  Food Insecurity: Not on file  Transportation Needs: Not on file  Physical Activity: Not on file  Stress: Not on file  Social Connections: Not on file  Intimate Partner Violence: Not on file     Allergies  Allergen Reactions   Wellbutrin [Bupropion] Other (See Comments)    Depression increased     Outpatient Medications Prior to Visit  Medication Sig Dispense Refill   amLODipine (NORVASC) 5 MG tablet Take 1 tablet every morning by mouth.      aspirin 81 MG tablet Take 81 mg by mouth daily.     atorvastatin (LIPITOR) 20 MG tablet Take 1 tablet (20 mg total) by mouth daily. 30 tablet 11   carvedilol (COREG) 25 MG tablet Take 1 tablet (25 mg total) by mouth 2 (two) times daily. 30 tablet 6    Cholecalciferol (D-3-5 PO) Take by mouth.     ezetimibe (ZETIA) 10 MG tablet TAKE 1 TABLET BY MOUTH ONCE DAILY 90 tablet 0   fluticasone (FLONASE) 50 MCG/ACT nasal spray Place as needed into both nostrils.      fluticasone furoate-vilanterol (BREO ELLIPTA) 200-25 MCG/ACT AEPB Inhale 1 puff into the lungs daily. 28 each 6   L-Methylfolate 15 MG TABS Take 15 mg by mouth daily.     lisinopril (PRINIVIL,ZESTRIL) 40 MG tablet Take 40 mg every morning by mouth.      mirtazapine (REMERON) 30 MG tablet Take 30 mg by mouth at bedtime.     Multiple Vitamin (MULTIVITAMIN) tablet Take 1 tablet by mouth daily.     Potassium Chloride ER 20 MEQ TBCR Take 1 tablet by mouth daily.     tamsulosin (FLOMAX) 0.4 MG CAPS capsule Take 0.4 mg by mouth daily.     TRINTELLIX 20 MG TABS tablet Take 20 mg by mouth daily.     albuterol (VENTOLIN HFA) 108 (90 Base) MCG/ACT inhaler Inhale 2 puffs into the lungs every 6 (six) hours as needed for wheezing or shortness of breath. 8 g 2   vortioxetine HBr (TRINTELLIX) 10 MG TABS tablet Take 10 mg by mouth daily.     No facility-administered medications prior to visit.    Review of Systems  Constitutional:  Negative for chills, fever, malaise/fatigue and weight loss.  HENT:  Negative for congestion, sinus pain and sore throat.   Eyes: Negative.   Respiratory:  Positive for cough, sputum production and shortness of breath. Negative for hemoptysis and wheezing.   Cardiovascular:  Negative for chest pain, palpitations, orthopnea, claudication and leg swelling.  Gastrointestinal:  Negative for abdominal pain, heartburn, nausea and vomiting.  Genitourinary: Negative.   Musculoskeletal:  Negative for joint pain and myalgias.  Skin:  Negative for rash.  Neurological:  Negative for weakness.  Endo/Heme/Allergies: Negative.   Psychiatric/Behavioral: Negative.     Objective:   Vitals:   03/13/21 0900  BP: 118/74  Pulse: 66  SpO2: 99%  Weight: 249 lb 8 oz (113.2 kg)   Height: 6\' 2"  (1.88 m)   Physical Exam Constitutional:      General: He is not in acute distress. HENT:     Head: Normocephalic and atraumatic.  Eyes:     Extraocular Movements: Extraocular movements intact.     Conjunctiva/sclera: Conjunctivae normal.     Pupils: Pupils are equal, round, and reactive to light.  Cardiovascular:     Rate and Rhythm: Normal rate and regular  rhythm.     Pulses: Normal pulses.     Heart sounds: Normal heart sounds. No murmur heard. Pulmonary:     Effort: Pulmonary effort is normal.     Breath sounds: Decreased breath sounds present. No wheezing, rhonchi or rales.  Abdominal:     General: Bowel sounds are normal.     Palpations: Abdomen is soft.  Musculoskeletal:     Right lower leg: No edema.     Left lower leg: No edema.  Lymphadenopathy:     Cervical: No cervical adenopathy.  Skin:    General: Skin is warm and dry.  Neurological:     General: No focal deficit present.     Mental Status: He is alert.  Psychiatric:        Mood and Affect: Mood normal.        Behavior: Behavior normal.        Thought Content: Thought content normal.        Judgment: Judgment normal.   CBC    Component Value Date/Time   HGB 15.3 01/02/2017 0713   HCT 45.0 01/02/2017 0713     Chest imaging: CT Chest LCS 01/31/21 Mediastinum/Nodes: Esophagus and thyroid are unremarkable. No pathologically enlarged lymph nodes seen in the chest.   Lungs/Pleura: Central airways are patent. Mild centrilobular emphysema. No consolidation, pleural effusion or pneumothorax. Small solid pulmonary nodule of the right upper lobe measuring 2 mm on image 105.  CXR 09/07/2020 Lung volumes are normal. Mild diffuse peribronchial cuffing. No consolidative airspace disease. No pleural effusions. No pneumothorax. No pulmonary nodule or mass noted. Pulmonary vasculature and the cardiomediastinal silhouette are within normal limits. Atherosclerosis in the thoracic aorta.  PFT: No  flowsheet data found.  Labs:  Path:  Echo 05/04/2019: LVEF 55-60%. Grade I diastolic dysfunction. RV size is normal.   Heart Catheterization:  Assessment & Plan:   Centrilobular emphysema (HCC)  Current smoker  Discussion: Antonio Fuller is a 71 year old male, daily smoker with GERD, OSA on CPAP, hypertension and cardiomyopathy who is referred to pulmonary clinic for chronic cough.   He has mild centrilobular emphysema as noted on his recent lung cancer screening CT chest.  His next CT chest scan is not due until next year as the scan was lung RADS 2S.  He is to continue Breo Ellipta 100-25 MCG 1 puff daily.  We discussed smoking cessation using nicotine replacement therapy with 7 mg nicotine patch daily and as needed many nicotine lozenges.  He has done great cutting back is much as he has at this time.  Follow-up in 6 months with pulmonary function test.  Melody ComasJonathan Sherif Millspaugh, MD [facility] Office: [phone]   Current Outpatient Medications:    amLODipine (NORVASC) 5 MG tablet, Take 1 tablet every morning by mouth. , Disp: , Rfl:    aspirin 81 MG tablet, Take 81 mg by mouth daily., Disp: , Rfl:    atorvastatin (LIPITOR) 20 MG tablet, Take 1 tablet (20 mg total) by mouth daily., Disp: 30 tablet, Rfl: 11   carvedilol (COREG) 25 MG tablet, Take 1 tablet (25 mg total) by mouth 2 (two) times daily., Disp: 30 tablet, Rfl: 6   Cholecalciferol (D-3-5 PO), Take by mouth., Disp: , Rfl:    ezetimibe (ZETIA) 10 MG tablet, TAKE 1 TABLET BY MOUTH ONCE DAILY, Disp: 90 tablet, Rfl: 0   fluticasone (FLONASE) 50 MCG/ACT nasal spray, Place as needed into both nostrils. , Disp: , Rfl:  fluticasone furoate-vilanterol (BREO ELLIPTA) 200-25 MCG/ACT AEPB, Inhale 1 puff into the lungs daily., Disp: 28 each, Rfl: 6   L-Methylfolate 15 MG TABS, Take 15 mg by mouth daily., Disp: , Rfl:    lisinopril (PRINIVIL,ZESTRIL) 40 MG tablet, Take 40 mg every morning by mouth. , Disp: ,  Rfl:    mirtazapine (REMERON) 30 MG tablet, Take 30 mg by mouth at bedtime., Disp: , Rfl:    Multiple Vitamin (MULTIVITAMIN) tablet, Take 1 tablet by mouth daily., Disp: , Rfl:    Potassium Chloride ER 20 MEQ TBCR, Take 1 tablet by mouth daily., Disp: , Rfl:    tamsulosin (FLOMAX) 0.4 MG CAPS capsule, Take 0.4 mg by mouth daily., Disp: , Rfl:    TRINTELLIX 20 MG TABS tablet, Take 20 mg by mouth daily., Disp: , Rfl:    albuterol (VENTOLIN HFA) 108 (90 Base) MCG/ACT inhaler, INHALE 2 PUFFS INTO THE LUNGS EVERY 6 HOURS AS NEEDED FOR WHEEZING OR SHORTNESS OF BREATH, Disp: 6.7 g, Rfl: 5

## 2021-03-13 NOTE — Patient Instructions (Signed)
Continue on Breo ellipta 1 puff daily - rinse mouth after each use  Recommend using nicotine patch 7mg  daily and as needed nicotine lozenges or gum for break through cravings.  Your CT Chest scan does not show any concerning growths on the lung at this time, will follow up with scan in December 2023. There are mild emphysematous changes.   We will schedule you for pulmonary function tests in the near future  Follow up in 6 months

## 2021-03-15 ENCOUNTER — Other Ambulatory Visit: Payer: Self-pay | Admitting: Pulmonary Disease

## 2021-03-15 DIAGNOSIS — R053 Chronic cough: Secondary | ICD-10-CM

## 2021-03-24 ENCOUNTER — Encounter: Payer: Self-pay | Admitting: Pulmonary Disease

## 2021-06-06 NOTE — Progress Notes (Signed)
?  ?Cardiology Office Note ? ? ?Date:  06/07/2021  ? ?ID:  Antonio Fuller, DOB Oct 05, 1950, MRN [id number] ? ?PCP:  Blair Heys, MD  ?Cardiologist:   Rollene Rotunda, MD ? ? ?Chief Complaint  ?Patient presents with  ? Shortness of Breath  ? ? ?  ?History of Present Illness: ?Antonio Fuller is a 71 y.o. 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  The etiology this was felt possibly to be related to alcohol.    Several years ago it was as low as 15%.  His most recent ejection fraction was 55 to 60% on echo in March of last year.  He returns for follow up.   ? ?Since I last saw him he has seen a pulmonologist.  He has some emphysema.  He continues to have some cough.  This is intermittently productive of a clear fluid particularly in the morning.  He wakes up with his cough.  He does use CPAP.  He has some throat discomfort occasionally.  This happens sporadically and is not always associated with food.  Is not always associated with positions.  This been going on for several months.  Is like a burning discomfort and he had it when I laid him down to the exam.  He has had some vague mild chest discomfort all the time.  He has some chronic shortness of breath and currently is being treated for probable bronchitis.  He did have screening CT of his lungs and was found to have coronary calcium and aortic atherosclerosis. ? ? ?Past Medical History:  ?Diagnosis Date  ? Anxiety   ? Cardiomyopathy, dilated, nonischemic (HCC) cardiolgoist-  dr Jeanpaul Biehl  ? Dyspnea on exertion   ? GERD (gastroesophageal reflux disease)   ? per pt drinks water  ? Hypercholesteremia   ? Hypertension   ? OSA on CPAP   ? Pre-diabetes   ? Tobacco user   ? ? ?Past Surgical History:  ?Procedure Laterality Date  ? APPENDECTOMY  1990s  ? CARDIAC CATHETERIZATION  03-22-2001  dr Maylon Cos  ? severe LV dysfunction in a global matter;  normal coronaries, ef 20%  ? COLONOSCOPY  last one 2016  ? EVALUATION UNDER ANESTHESIA WITH  HEMORRHOIDECTOMY N/A 01/02/2017  ? Procedure: ANORECTAL EXAM UNDER ANESTHESIA WITH HEMORRHOIDECTOMY AND HEMORRHOIDAL LIGATION/PEXY X2;  Surgeon: Karie Soda, MD;  Location: [facility];  Service: General;  Laterality: N/A;  GENERAL AND LOCAL  ? HEMORRHOID SURGERY  2008 approx.  ? MASS EXCISION Left 01/02/2017  ? Procedure: REMOVAL OF MASS ON LEFT UPPER GLUTEUS;  Surgeon: Karie Soda, MD;  Location: [facility];  Service: General;  Laterality: Left;  GENERAL AND LOCAL  ? TONSILLECTOMY  child  ? TRANSTHORACIC ECHOCARDIOGRAM  02-23-2014  dr Magdalynn Davilla  ? mild focal hypertrophy of the septum, ef 45-50%,  grade 1 diastolic dysfunction/  trivial MR and TR (no change when compared to last study 02-20-2010)  ? UMBILICAL HERNIA REPAIR  1990s  ? ? ? ?Current Outpatient Medications  ?Medication Sig Dispense Refill  ? albuterol (VENTOLIN HFA) 108 (90 Base) MCG/ACT inhaler INHALE 2 PUFFS INTO THE LUNGS EVERY 6 HOURS AS NEEDED FOR WHEEZING OR SHORTNESS OF BREATH 6.7 g 5  ? amLODipine (NORVASC) 5 MG tablet Take 1 tablet every morning by mouth.     ? aspirin 81 MG tablet Take 81 mg by mouth daily.    ? atorvastatin (LIPITOR) 20 MG tablet Take 1 tablet (20 mg total) by mouth  daily. 30 tablet 11  ? carvedilol (COREG) 25 MG tablet Take 1 tablet (25 mg total) by mouth 2 (two) times daily. 30 tablet 6  ? Cholecalciferol (D-3-5 PO) Take by mouth.    ? ezetimibe (ZETIA) 10 MG tablet TAKE 1 TABLET BY MOUTH ONCE DAILY 90 tablet 0  ? fluticasone furoate-vilanterol (BREO ELLIPTA) 200-25 MCG/ACT AEPB Inhale 1 puff into the lungs daily. 28 each 6  ? L-Methylfolate 15 MG TABS Take 15 mg by mouth daily.    ? losartan (COZAAR) 50 MG tablet Take 1 tablet (50 mg total) by mouth daily. 90 tablet 3  ? mirtazapine (REMERON) 30 MG tablet Take 30 mg by mouth at bedtime.    ? Multiple Vitamin (MULTIVITAMIN) tablet Take 1 tablet by mouth daily.    ? Potassium Chloride ER 20 MEQ TBCR Take 1 tablet by mouth daily.    ?  tamsulosin (FLOMAX) 0.4 MG CAPS capsule Take 0.4 mg by mouth daily.    ? TRINTELLIX 20 MG TABS tablet Take 20 mg by mouth daily.    ? ?No current facility-administered medications for this visit.  ? ? ?Allergies:   Wellbutrin [bupropion]  ? ? ?ROS:  Please see the history of present illness.   Otherwise, review of systems are positive for kidney stones.   All other systems are reviewed and negative.  ? ? ?PHYSICAL EXAM: ?VS:  BP 138/90   Pulse 72   Ht 6\' 2"  (1.88 m)   Wt 249 lb 6.4 oz (113.1 kg)   SpO2 95%   BMI 32.02 kg/m?  , BMI Body mass index is 32.02 kg/m?. ?GENERAL:  Well appearing ?NECK:  No jugular venous distention, waveform within normal limits, carotid upstroke brisk and symmetric, no bruits, no thyromegaly ?LUNGS: Bilateral scattered wheezing ?CHEST:  Unremarkable ?HEART:  PMI not displaced or sustained,S1 and S2 within normal limits, no S3, no S4, no clicks, no rubs, no murmurs ?ABD:  Flat, positive bowel sounds normal in frequency in pitch, no bruits, no rebound, no guarding, no midline pulsatile mass, no hepatomegaly, no splenomegaly ?EXT:  2 plus pulses throughout, no edema, no cyanosis no clubbing ? ?EKG:  EKG is  ordered today. ?The ekg ordered today demonstrates sinus rhythm, rate 72 axis within normal limits, intervals within normal limits, poor anterior R wave progression.  No change from recent and distant EKGs. ? ? ?Recent Labs: ?No results found for requested labs within last 8760 hours.  ? ? ?Lipid Panel ?No results found for: CHOL, TRIG, HDL, CHOLHDL, VLDL, LDLCALC, LDLDIRECT ?  ? ?Wt Readings from Last 3 Encounters:  ?06/07/21 249 lb 6.4 oz (113.1 kg)  ?03/13/21 249 lb 8 oz (113.2 kg)  ?12/28/20 244 lb 6.4 oz (110.9 kg)  ?  ? ? ?Other studies Reviewed: ?Additional studies/ records that were reviewed today include: Labs, pulmonary records. ?Review of the above records demonstrates:  See elsewhere ? ? ?ASSESSMENT AND PLAN: ? ?Cardiomyopathy, dilated, nonischemic -  ?He does have some  shortness of breath.  I will check a BNP level. \ ? ?Hypertension -  ?His blood pressure is at target.  No change in therapy.  ? ?Tobacco user -  ?He has a planned date to quit smoking May 1. ? ?ETOH - ?He understands I would like to complete abstinence. ? ?AAA - ?This was 3.1 CM in 2021.  No further testing. ? ?COUGH: I am going to stop his lisinopril and start Cozaar 50 mg daily although I suspect his cough is  more related to smoking. ? ?SOB: Given his shortness of breath and coronary calcium I would like to screen him with a POET (Plain Old Exercise Treadmill)  ? ?AORTIC ATHEROSCLEROSIS: We are actively managing this with risk reduction.  His LDL was 70 in October HDL 54.  He does have some mildly elevated A1c that is being managed.  His blood pressure is at target.  No change in therapy. ? ?Current medicines are reviewed at length with the patient today.  The patient does not have concerns regarding medicines. ? ?The following changes have been made: As above ? ?Labs/ tests ordered today include:   ? ?Orders Placed This Encounter  ?Procedures  ? Brain natriuretic peptide  ? Exercise Tolerance Test  ? EKG 12-Lead  ? ? ? ?Disposition:   FU with me in 1 year.  ? ? ?Signed, ?Rollene Rotunda, MD  ?06/07/2021 10:00 AM    ?[facility] ? ? ?

## 2021-06-07 ENCOUNTER — Ambulatory Visit: Payer: BC Managed Care – PPO | Admitting: Cardiology

## 2021-06-07 ENCOUNTER — Encounter: Payer: Self-pay | Admitting: Cardiology

## 2021-06-07 VITALS — BP 138/90 | HR 72 | Ht 74.0 in | Wt 249.4 lb

## 2021-06-07 DIAGNOSIS — R0602 Shortness of breath: Secondary | ICD-10-CM

## 2021-06-07 DIAGNOSIS — Z720 Tobacco use: Secondary | ICD-10-CM

## 2021-06-07 DIAGNOSIS — I420 Dilated cardiomyopathy: Secondary | ICD-10-CM | POA: Diagnosis not present

## 2021-06-07 DIAGNOSIS — I10 Essential (primary) hypertension: Secondary | ICD-10-CM | POA: Diagnosis not present

## 2021-06-07 MED ORDER — LOSARTAN POTASSIUM 50 MG PO TABS: 50.0000 mg | ORAL_TABLET | Freq: Every day | ORAL | 3 refills | Status: AC

## 2021-06-07 NOTE — Patient Instructions (Signed)
Medication Instructions:  ?STOP the Lisinopril ? ?START Losartan 50 mg once daily ? ?*If you need a refill on your cardiac medications before your next appointment, please call your pharmacy* ? ? ?Lab Work: ?Your provider would like for you to have the following labs today: BNP ? ?If you have labs (blood work) drawn today and your tests are completely normal, you will receive your results only by: ?MyChart Message (if you have MyChart) OR ?A paper copy in the mail ?If you have any lab test that is abnormal or we need to change your treatment, we will call you to review the results. ? ? ?Testing/Procedures: ?Your physician has requested that you have an exercise tolerance test. For further information please visit [email or web address] Please also follow instruction sheet, as given. This will take place at [address] [facility], [address]. ?Do not drink or eat foods with caffeine for 24 hours before the test. (Chocolate, coffee, tea, or energy drinks) ?If you use an inhaler, bring it with you to the test. ?Do not smoke for 4 hours before the test. ?Wear comfortable shoes and clothing. ? ? ?Follow-Up: ?At [facility], you and your health needs are our priority.  As part of our continuing mission to provide you with exceptional heart care, we have created designated Provider Care Teams.  These Care Teams include your primary Cardiologist (physician) and Advanced Practice Providers (APPs -  Physician Assistants and Nurse Practitioners) who all work together to provide you with the care you need, when you need it. ? ?We recommend signing up for the patient portal called "MyChart".  Sign up information is provided on this After Visit Summary.  MyChart is used to connect with patients for Virtual Visits (Telemedicine).  Patients are able to view lab/test results, encounter notes, upcoming appointments, etc.  Non-urgent messages can be sent to your provider as well.   ?To learn more about what you can do with MyChart, go to  [email or web address].   ? ?Your next appointment:   ?12 month(s) ? ?The format for your next appointment:   ?In Person ? ?Provider:   ?Rollene Rotunda, MD { ? ?I ? ?Important Information About Sugar ? ? ? ? ? ?

## 2021-06-08 LAB — BRAIN NATRIURETIC PEPTIDE: BNP: 10.5 pg/mL (ref 0.0–100.0)

## 2021-06-21 ENCOUNTER — Telehealth (HOSPITAL_COMMUNITY): Payer: Self-pay | Admitting: *Deleted

## 2021-06-21 NOTE — Telephone Encounter (Signed)
Close encounter 

## 2021-06-25 ENCOUNTER — Ambulatory Visit (HOSPITAL_COMMUNITY)
Admission: RE | Admit: 2021-06-25 | Discharge: 2021-06-25 | Disposition: A | Discharge status: Home / Self Care | Payer: BC Managed Care – PPO | Source: Ambulatory Visit | Attending: Cardiology | Admitting: Cardiology

## 2021-06-25 DIAGNOSIS — R0602 Shortness of breath: Secondary | ICD-10-CM

## 2021-07-11 ENCOUNTER — Encounter (HOSPITAL_COMMUNITY): Payer: BC Managed Care – PPO

## 2021-07-16 ENCOUNTER — Telehealth (HOSPITAL_COMMUNITY): Payer: Self-pay | Admitting: *Deleted

## 2021-07-16 NOTE — Telephone Encounter (Signed)
Close encounter 

## 2021-07-18 ENCOUNTER — Ambulatory Visit (HOSPITAL_COMMUNITY)
Admission: RE | Admit: 2021-07-18 | Discharge: 2021-07-18 | Disposition: A | Discharge status: Home / Self Care | Payer: BC Managed Care – PPO | Source: Ambulatory Visit | Attending: Cardiology | Admitting: Cardiology

## 2021-07-18 ENCOUNTER — Other Ambulatory Visit: Payer: Self-pay | Admitting: Pulmonary Disease

## 2021-07-18 DIAGNOSIS — R0602 Shortness of breath: Secondary | ICD-10-CM | POA: Insufficient documentation

## 2021-07-18 DIAGNOSIS — R053 Chronic cough: Secondary | ICD-10-CM

## 2021-07-18 LAB — EXERCISE TOLERANCE TEST
Angina Index: 0
Duke Treadmill Score: 7
Estimated workload: 8.5
Exercise duration (min): 7 min
Exercise duration (sec): 0 s
MPHR: 151 {beats}/min
Peak HR: 120 {beats}/min
Percent HR: 79 %
Rest HR: 71 {beats}/min
ST Depression (mm): 0 mm

## 2021-12-07 ENCOUNTER — Other Ambulatory Visit: Payer: Self-pay | Admitting: Pulmonary Disease

## 2021-12-07 DIAGNOSIS — R053 Chronic cough: Secondary | ICD-10-CM

## 2021-12-09 ENCOUNTER — Other Ambulatory Visit: Payer: Self-pay | Admitting: Pulmonary Disease

## 2021-12-09 DIAGNOSIS — R053 Chronic cough: Secondary | ICD-10-CM

## 2022-01-31 ENCOUNTER — Ambulatory Visit (HOSPITAL_COMMUNITY)
Admission: RE | Admit: 2022-01-31 | Discharge: 2022-01-31 | Disposition: A | Discharge status: Home / Self Care | Payer: Medicare PPO | Source: Ambulatory Visit | Attending: Acute Care | Admitting: Acute Care

## 2022-01-31 DIAGNOSIS — Z122 Encounter for screening for malignant neoplasm of respiratory organs: Secondary | ICD-10-CM | POA: Insufficient documentation

## 2022-01-31 DIAGNOSIS — I2510 Atherosclerotic heart disease of native coronary artery without angina pectoris: Secondary | ICD-10-CM | POA: Diagnosis not present

## 2022-01-31 DIAGNOSIS — J439 Emphysema, unspecified: Secondary | ICD-10-CM | POA: Insufficient documentation

## 2022-01-31 DIAGNOSIS — F17210 Nicotine dependence, cigarettes, uncomplicated: Secondary | ICD-10-CM | POA: Diagnosis present

## 2022-01-31 DIAGNOSIS — I700 Atherosclerosis of aorta: Secondary | ICD-10-CM | POA: Insufficient documentation

## 2022-01-31 DIAGNOSIS — Z87891 Personal history of nicotine dependence: Secondary | ICD-10-CM

## 2022-02-03 ENCOUNTER — Other Ambulatory Visit: Payer: Self-pay | Admitting: Acute Care

## 2022-02-03 DIAGNOSIS — Z122 Encounter for screening for malignant neoplasm of respiratory organs: Secondary | ICD-10-CM

## 2022-02-03 DIAGNOSIS — Z87891 Personal history of nicotine dependence: Secondary | ICD-10-CM

## 2022-02-03 DIAGNOSIS — F17210 Nicotine dependence, cigarettes, uncomplicated: Secondary | ICD-10-CM

## 2022-03-05 DIAGNOSIS — G4733 Obstructive sleep apnea (adult) (pediatric): Secondary | ICD-10-CM | POA: Diagnosis not present

## 2022-03-11 ENCOUNTER — Other Ambulatory Visit: Payer: Self-pay | Admitting: Pulmonary Disease

## 2022-03-11 DIAGNOSIS — R053 Chronic cough: Secondary | ICD-10-CM

## 2022-04-14 DIAGNOSIS — H43812 Vitreous degeneration, left eye: Secondary | ICD-10-CM | POA: Diagnosis not present

## 2022-04-14 DIAGNOSIS — H524 Presbyopia: Secondary | ICD-10-CM | POA: Diagnosis not present

## 2022-04-14 DIAGNOSIS — H40013 Open angle with borderline findings, low risk, bilateral: Secondary | ICD-10-CM | POA: Diagnosis not present

## 2022-04-14 DIAGNOSIS — H2513 Age-related nuclear cataract, bilateral: Secondary | ICD-10-CM | POA: Diagnosis not present

## 2022-04-14 DIAGNOSIS — R7309 Other abnormal glucose: Secondary | ICD-10-CM | POA: Diagnosis not present

## 2022-05-06 ENCOUNTER — Other Ambulatory Visit: Payer: Self-pay | Admitting: Pulmonary Disease

## 2022-05-06 DIAGNOSIS — R053 Chronic cough: Secondary | ICD-10-CM

## 2022-05-23 DIAGNOSIS — R3121 Asymptomatic microscopic hematuria: Secondary | ICD-10-CM | POA: Diagnosis not present

## 2022-05-23 DIAGNOSIS — N200 Calculus of kidney: Secondary | ICD-10-CM | POA: Diagnosis not present

## 2022-05-26 ENCOUNTER — Ambulatory Visit: Payer: Medicare PPO | Admitting: Adult Health

## 2022-05-26 ENCOUNTER — Encounter: Payer: Self-pay | Admitting: Adult Health

## 2022-05-26 VITALS — BP 106/80 | HR 73 | Temp 98.2°F | Ht 74.0 in | Wt 250.0 lb

## 2022-05-26 DIAGNOSIS — G4730 Sleep apnea, unspecified: Secondary | ICD-10-CM

## 2022-05-26 DIAGNOSIS — R053 Chronic cough: Secondary | ICD-10-CM

## 2022-05-26 DIAGNOSIS — J439 Emphysema, unspecified: Secondary | ICD-10-CM | POA: Insufficient documentation

## 2022-05-26 DIAGNOSIS — Z720 Tobacco use: Secondary | ICD-10-CM

## 2022-05-26 MED ORDER — ALBUTEROL SULFATE HFA 108 (90 BASE) MCG/ACT IN AERS: 2.0000 | INHALATION_SPRAY | Freq: Four times a day (QID) | RESPIRATORY_TRACT | 3 refills | Status: DC | PRN

## 2022-05-26 MED ORDER — FLUTICASONE FUROATE-VILANTEROL 200-25 MCG/ACT IN AEPB: 1.0000 | INHALATION_SPRAY | Freq: Every day | RESPIRATORY_TRACT | 3 refills | Status: DC

## 2022-05-26 NOTE — Assessment & Plan Note (Signed)
Smoking cessation discussed 

## 2022-05-26 NOTE — Progress Notes (Signed)
@Patient  ID: Antonio Fuller, male    DOB: [birth year], 72 y.o.   MRN: [id number]  Chief Complaint  Patient presents with   Follow-up    Referring provider: Blair Heys, MD  HPI: 72 year old male active smoker followed for emphysema and Chronic cough  Medical history significant for cardiomyopathy and OSA on CPAP  Participates in the lung cancer screening CT program   TEST/EVENTS :  LVEF 55-60%. Grade I diastolic dysfunction. RV size is normal.   CT chest January 31, 2022 shows mild emphysema, stable tiny right upper lobe pulmonary nodule.  No new nodules.  Lung RADS 2.  05/26/2022 Follow up : Emphysema and chronic cough Patient presents for a 1 year follow-up.  Patient has underlying emphysema and chronic cough.  Cough has improved after starting on Breo.  Since last visit patient says he has been doing well.  He has had no increased cough or shortness of breath.  No albuterol use.  Patient was recommended for PFTs but unfortunately has not completed. Has not been as active over winter months. Starting to do yard work.  Patient participates in the lung cancer CT screening program.  Last CT was in December 2023 that showed mild emphysema.  Stable tiny right upper lobe nodule.  Plan CT chest in December 2024.  Patient needs refill of Breo.   Discussed smoking cessation. Trying to cut back on smoking.  Gets vaccine from PCP.  Got Flu and covid booster in Nov 2023.  Is on CPAP for sleep apnea managed by [facility]. Wear CPAP each night, makes  a world of difference .     Allergies  Allergen Reactions   Chantix [Varenicline Tartrate] Other (See Comments)    Suicidal thoughts.     There is no immunization history on file for this patient.  Past Medical History:  Diagnosis Date   Anxiety    Cardiomyopathy, dilated, nonischemic cardiolgoist-  dr hochrein   Dyspnea on exertion    GERD (gastroesophageal reflux disease)    per pt drinks water   Hypercholesteremia     Hypertension    OSA on CPAP    Pre-diabetes    Tobacco user     Tobacco History: Social History   Tobacco Use  Smoking Status Every Day   Packs/day: 1.00   Years: 40.00   Additional pack years: 0.00   Total pack years: 40.00   Types: Cigarettes  Smokeless Tobacco Never  Tobacco Comments   Smoking 6-8 per day currently.  Trying to quit.  05/26/2022 hfb   Ready to quit: No Counseling given: Yes Tobacco comments: Smoking 6-8 per day currently.  Trying to quit.  05/26/2022 hfb   Outpatient Medications Prior to Visit  Medication Sig Dispense Refill   amLODipine (NORVASC) 5 MG tablet Take 1 tablet every morning by mouth.      aspirin 81 MG tablet Take 81 mg by mouth daily.     atorvastatin (LIPITOR) 20 MG tablet Take 1 tablet (20 mg total) by mouth daily. 30 tablet 11   carvedilol (COREG) 25 MG tablet Take 1 tablet (25 mg total) by mouth 2 (two) times daily. 30 tablet 6   Cholecalciferol (D-3-5 PO) Take by mouth.     ezetimibe (ZETIA) 10 MG tablet TAKE 1 TABLET BY MOUTH ONCE DAILY 90 tablet 0   L-Methylfolate 15 MG TABS Take 15 mg by mouth daily.     losartan (COZAAR) 50 MG tablet Take 1 tablet (50 mg total) by mouth  daily. 90 tablet 3   mirtazapine (REMERON) 30 MG tablet Take 30 mg by mouth at bedtime.     Multiple Vitamin (MULTIVITAMIN) tablet Take 1 tablet by mouth daily.     Potassium Chloride ER 20 MEQ TBCR Take 1 tablet by mouth daily.     tamsulosin (FLOMAX) 0.4 MG CAPS capsule Take 0.4 mg by mouth daily after breakfast.     TRINTELLIX 20 MG TABS tablet Take 20 mg by mouth daily.     albuterol (VENTOLIN HFA) 108 (90 Base) MCG/ACT inhaler INHALE 2 PUFFS INTO THE LUNGS EVERY 6 HOURS AS NEEDED FOR WHEEZING OR SHORTNESS OF BREATH 6.7 g 5   fluticasone furoate-vilanterol (BREO ELLIPTA) 200-25 MCG/ACT AEPB INHALE 1 PUFF INTO THE LUNGS DAILY 180 each 0   tamsulosin (FLOMAX) 0.4 MG CAPS capsule Take 0.4 mg by mouth daily. (Patient not taking: Reported on 05/26/2022)     No  facility-administered medications prior to visit.     Review of Systems:   Constitutional:   No  weight loss, night sweats,  Fevers, chills, fatigue, or  lassitude.  HEENT:   No headaches,  Difficulty swallowing,  Tooth/dental problems, or  Sore throat,                No sneezing, itching, ear ache, nasal congestion, post nasal drip,   CV:  No chest pain,  Orthopnea, PND, swelling in lower extremities, anasarca, dizziness, palpitations, syncope.   GI  No heartburn, indigestion, abdominal pain, nausea, vomiting, diarrhea, change in bowel habits, loss of appetite, bloody stools.   Resp: No shortness of breath with exertion or at rest.  No excess mucus, no productive cough,  No non-productive cough,  No coughing up of blood.  No change in color of mucus.  No wheezing.  No chest wall deformity  Skin: no rash or lesions.  GU: no dysuria, change in color of urine, no urgency or frequency.  No flank pain, no hematuria   MS:  No joint pain or swelling.  No decreased range of motion.  No back pain.    Physical Exam  BP 106/80 (BP Location: Left Arm, Patient Position: Sitting, Cuff Size: Large)   Pulse 73   Temp 98.2 F (36.8 C) (Oral)   Ht 6\' 2"  (1.88 m)   Wt 250 lb (113.4 kg)   SpO2 98%   BMI 32.10 kg/m   GEN: A/Ox3; pleasant , NAD, well nourished    HEENT:  [place]/AT,  NOSE-clear, THROAT-clear, no lesions, no postnasal drip or exudate noted.  Class 3 MP airway  NECK:  Supple w/ fair ROM; no JVD; normal carotid impulses w/o bruits; no thyromegaly or nodules palpated; no lymphadenopathy.    RESP  Clear  P & A; w/o, wheezes/ rales/ or rhonchi. no accessory muscle use, no dullness to percussion  CARD:  RRR, no m/r/g, no peripheral edema, pulses intact, no cyanosis or clubbing.  GI:   Soft & nt; nml bowel sounds; no organomegaly or masses detected.   Musco: Warm bil, no deformities or joint swelling noted.   Neuro: alert, no focal deficits noted.    Skin: Warm, no lesions or  rashes    Lab Results:    BNP   ProBNP No results found for: "PROBNP"  Imaging: No results found.        No data to display          No results found for: "NITRICOXIDE"      Assessment & Plan:  Emphysema lung Controlled on Breo.  Refills given.  Continue with the yearly lung cancer CT screening program.  Smoking cessation discussed in detail.  Plan  Patient Instructions  Continue on BREO 1 puff daily, rinse after use.  Albuterol inhaler As needed   Activity as tolerated.  Work on not smoking  Continue with yearly CT chest screening  Follow up with Dr. Francine Graven in 4 months with PFT and As needed      Sleep apnea Continue on CPAP therapy and follow-up with [facility].  Tobacco user Smoking cessation discussed.     Rubye Oaks, NP 05/26/2022

## 2022-05-26 NOTE — Assessment & Plan Note (Signed)
Continue on CPAP therapy and follow-up with [facility].

## 2022-05-26 NOTE — Patient Instructions (Addendum)
Continue on BREO 1 puff daily, rinse after use.  Albuterol inhaler As needed   Activity as tolerated.  Work on not smoking  Continue with yearly CT chest screening  Follow up with Dr. Francine Graven in 4 months with PFT and As needed

## 2022-05-26 NOTE — Assessment & Plan Note (Signed)
Controlled on Breo.  Refills given.  Continue with the yearly lung cancer CT screening program.  Smoking cessation discussed in detail.  Plan  Patient Instructions  Continue on BREO 1 puff daily, rinse after use.  Albuterol inhaler As needed   Activity as tolerated.  Work on not smoking  Continue with yearly CT chest screening  Follow up with Dr. Francine Graven in 4 months with PFT and As needed

## 2022-05-26 NOTE — Progress Notes (Signed)
Has received all of the Pfizer vaccines offered.  Received his flu vaccine in 2023.

## 2022-05-28 ENCOUNTER — Telehealth: Payer: Self-pay | Admitting: Adult Health

## 2022-05-28 ENCOUNTER — Other Ambulatory Visit (HOSPITAL_COMMUNITY): Payer: Self-pay

## 2022-05-28 NOTE — Telephone Encounter (Signed)
Antonio Fuller is covered by insurance. Co-pay is $80.00 due to patient being in the Initial Benefit Phase per test claim

## 2022-05-28 NOTE — Telephone Encounter (Signed)
Patient states [name] not covered by insurance. Pharmacy is [facility] [address]. Patient phone number is [phone].

## 2022-05-28 NOTE — Telephone Encounter (Signed)
Since [name] is not covered can we run a ticket on patients insurance to see what's covered?

## 2022-05-29 NOTE — Telephone Encounter (Signed)
Called and spoke with pt letting him know info per prior auth team and he verbalized understanding. Nothing further needed. 

## 2022-06-04 DIAGNOSIS — G4733 Obstructive sleep apnea (adult) (pediatric): Secondary | ICD-10-CM | POA: Diagnosis not present

## 2022-06-13 DIAGNOSIS — E119 Type 2 diabetes mellitus without complications: Secondary | ICD-10-CM | POA: Diagnosis not present

## 2022-06-13 DIAGNOSIS — Z125 Encounter for screening for malignant neoplasm of prostate: Secondary | ICD-10-CM | POA: Diagnosis not present

## 2022-06-13 DIAGNOSIS — I10 Essential (primary) hypertension: Secondary | ICD-10-CM | POA: Diagnosis not present

## 2022-06-13 DIAGNOSIS — J432 Centrilobular emphysema: Secondary | ICD-10-CM | POA: Diagnosis not present

## 2022-06-13 DIAGNOSIS — E7800 Pure hypercholesterolemia, unspecified: Secondary | ICD-10-CM | POA: Diagnosis not present

## 2022-06-13 DIAGNOSIS — F411 Generalized anxiety disorder: Secondary | ICD-10-CM | POA: Diagnosis not present

## 2022-06-13 DIAGNOSIS — I420 Dilated cardiomyopathy: Secondary | ICD-10-CM | POA: Diagnosis not present

## 2022-06-13 DIAGNOSIS — E1169 Type 2 diabetes mellitus with other specified complication: Secondary | ICD-10-CM | POA: Diagnosis not present

## 2022-06-13 DIAGNOSIS — G4733 Obstructive sleep apnea (adult) (pediatric): Secondary | ICD-10-CM | POA: Diagnosis not present

## 2022-06-13 DIAGNOSIS — Z0000 Encounter for general adult medical examination without abnormal findings: Secondary | ICD-10-CM | POA: Diagnosis not present

## 2022-06-13 DIAGNOSIS — I700 Atherosclerosis of aorta: Secondary | ICD-10-CM | POA: Diagnosis not present

## 2022-06-13 LAB — LAB REPORT - SCANNED
A1c: 6.3
EGFR: 86

## 2022-06-16 ENCOUNTER — Ambulatory Visit: Payer: Medicare PPO | Admitting: Pulmonary Disease

## 2022-06-19 DIAGNOSIS — L859 Epidermal thickening, unspecified: Secondary | ICD-10-CM | POA: Diagnosis not present

## 2022-06-19 DIAGNOSIS — D485 Neoplasm of uncertain behavior of skin: Secondary | ICD-10-CM | POA: Diagnosis not present

## 2022-08-09 IMAGING — CT CT CHEST LUNG CANCER SCREENING LOW DOSE W/O CM
1 series · 10 of 10 positions shown, 13 images · non-contrast
Comparison: None.

CLINICAL DATA: Current smoker with 48 pack-year history

EXAM:
CT CHEST WITHOUT CONTRAST LOW-DOSE FOR LUNG CANCER SCREENING
TECHNIQUE: Multidetector CT imaging of the chest was performed following the
standard protocol without IV contrast.

[ct lung segmentation data · axial · 0.80mm/px · z∈[-394,-394]mm · 10 of 344 frames shown]
[frame 1/344  mediastinal]
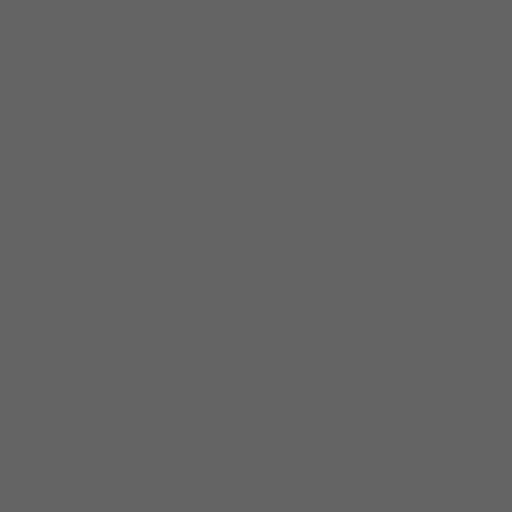
[frame 1/344  lung]
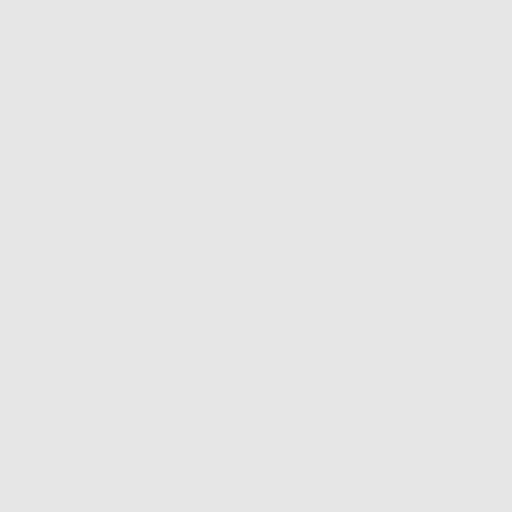
[frame 39/344  lung]
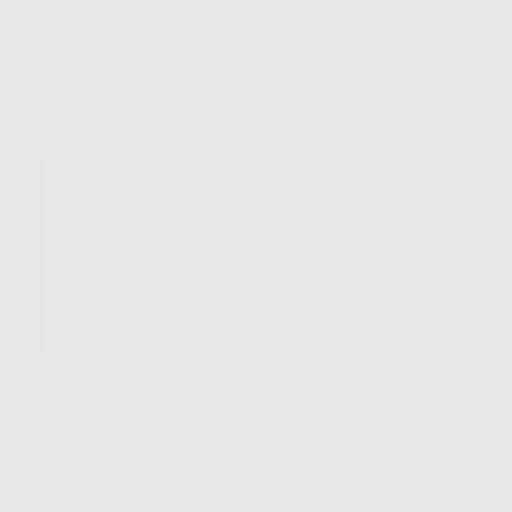
[frame 77/344  lung]
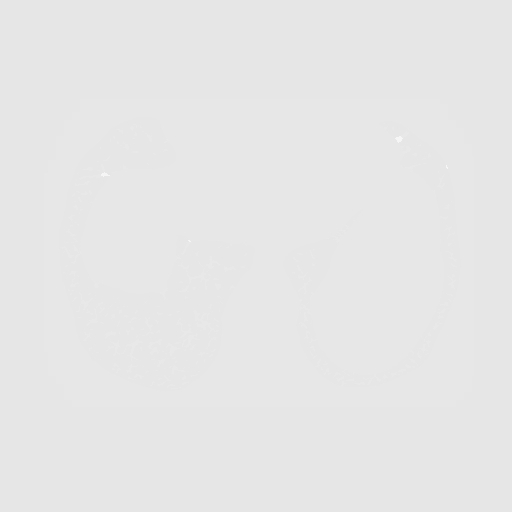
[frame 115/344  lung]
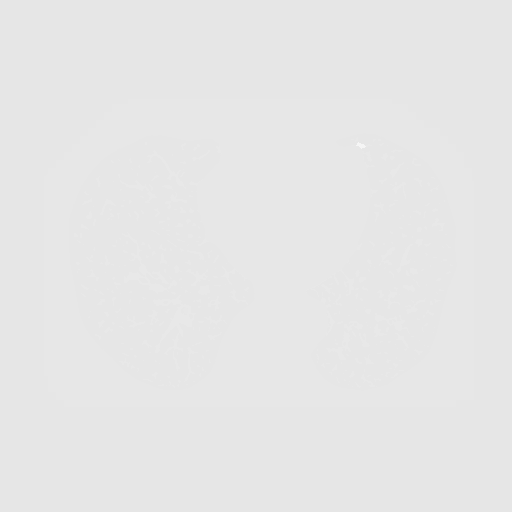
[frame 153/344  mediastinal]
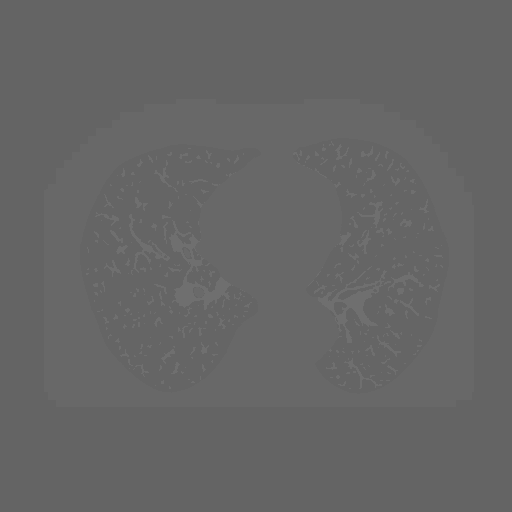
[frame 153/344  lung]
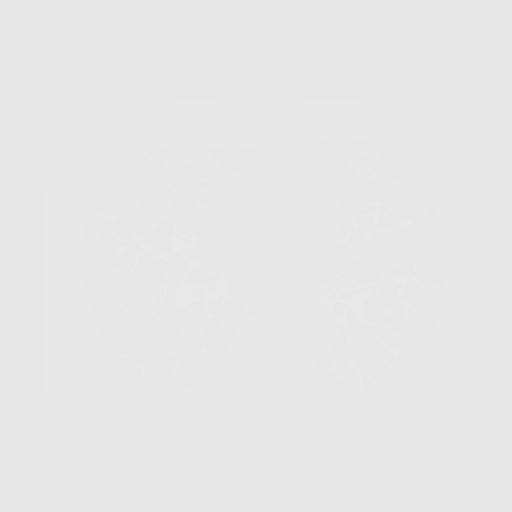
[frame 191/344  lung]
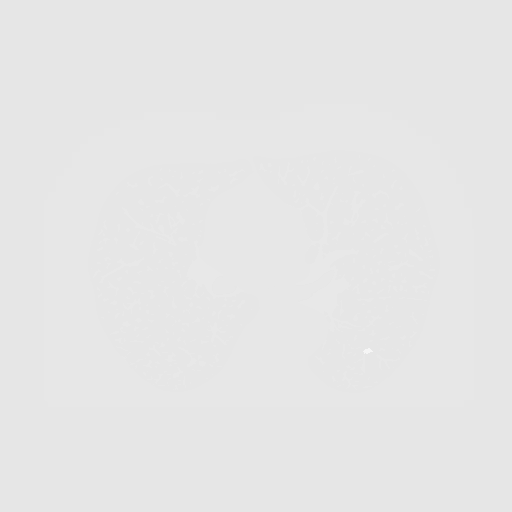
[frame 229/344  lung]
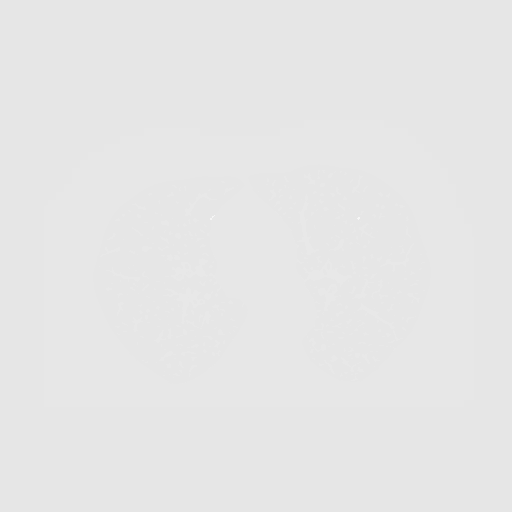
[frame 267/344  lung]
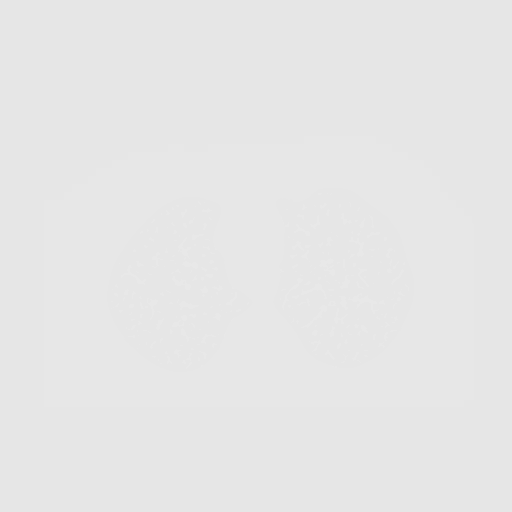
[frame 305/344  mediastinal]
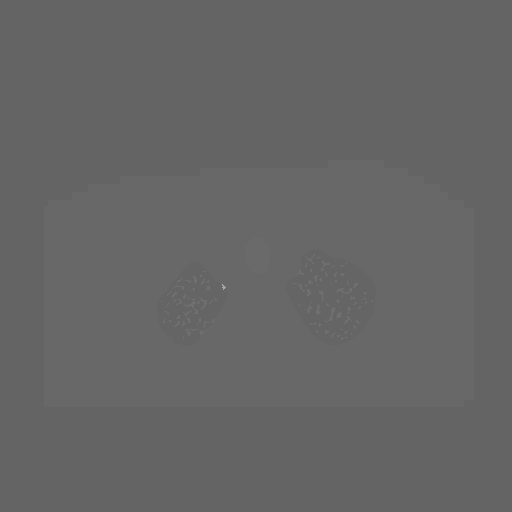
[frame 305/344  lung]
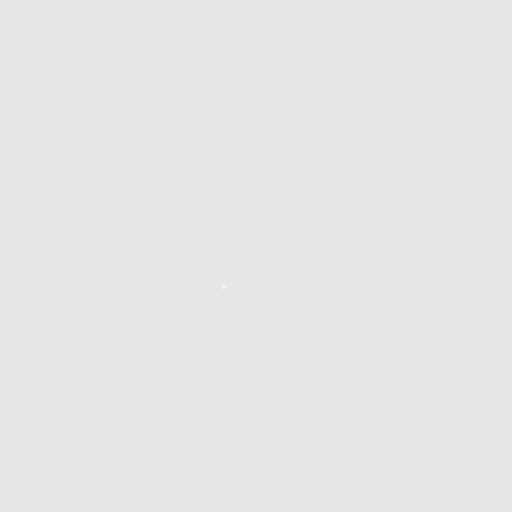
[frame 344/344  lung]
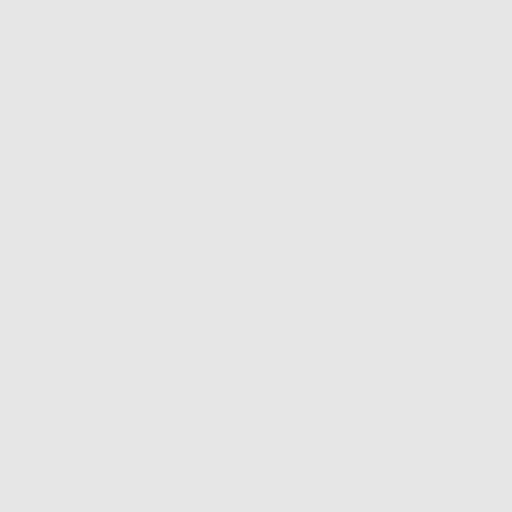

[10 of 10 positions shown; findings below may reference images not displayed]

FINDINGS: Cardiovascular: Normal heart size. No pericardial effusion.
Three-vessel coronary artery calcifications. Atherosclerotic disease
of the thoracic aorta.

Mediastinum/Nodes: Esophagus and thyroid are unremarkable. No
pathologically enlarged lymph nodes seen in the chest.

Lungs/Pleura: Central airways are patent. Mild centrilobular
emphysema. No consolidation, pleural effusion or pneumothorax. Small
solid pulmonary nodule of the right upper lobe measuring 2 mm on
image 105.

Upper Abdomen: No acute abnormality.

Musculoskeletal: No chest wall mass or suspicious bone lesions
identified.
IMPRESSION: 1. Lung-RADS 2S, benign appearance or behavior. Continue annual
screening with low-dose chest CT without contrast in 12 months. S
modifier for coronary artery calcifications.
2. Three-vessel coronary artery calcifications, recommend ASCVD risk
assessment.
3. Aortic Atherosclerosis (6AIHW-8TL.L) and Emphysema (6AIHW-L43.7).

## 2022-08-29 ENCOUNTER — Other Ambulatory Visit: Payer: Self-pay | Admitting: Adult Health

## 2022-08-29 DIAGNOSIS — R053 Chronic cough: Secondary | ICD-10-CM

## 2022-09-09 DIAGNOSIS — G4733 Obstructive sleep apnea (adult) (pediatric): Secondary | ICD-10-CM | POA: Diagnosis not present

## 2022-11-11 DIAGNOSIS — R059 Cough, unspecified: Secondary | ICD-10-CM | POA: Insufficient documentation

## 2022-11-11 DIAGNOSIS — I700 Atherosclerosis of aorta: Secondary | ICD-10-CM | POA: Insufficient documentation

## 2022-11-11 NOTE — Progress Notes (Unsigned)
Cardiology Office Note:   Date:  11/12/2022  ID:  Antonio Fuller, DOB [birth year], MRN [id number] PCP: Blair Heys, MD  [facility] Providers Cardiologist:  Rollene Rotunda, MD {  History of Present Illness:   Antonio Fuller is a 72 y.o. 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  The etiology this was felt possibly to be related to alcohol.    Several years ago it was as low as 15%.  His most recent ejection fraction was 55 to 60% on echo in March of 2021.    Since I last saw him he has had no new cardiovascular complaints other than some occasional numbness in the right side of his face.  He had this in 04/30/2022 where it was most pronounced.  He did not have any visual or speech or motor extremity issues and it resolved spontaneously.  He will occasionally get numbness up near his right ear and above his jaw and up sometimes further into his scalp and it may actually improve with changes in position.  He does not have any cardiovascular complaints such as shortness of breath, PND or orthopnea other than some baseline dyspnea that he has he thinks related to smoking.  He has no new symptoms since his stress test last year.  He has had no chest pressure, neck or arm discomfort.  He had no weight gain or edema.  ROS: As stated in the HPI and negative for all other systems.  Studies Reviewed:    EKG:   EKG Interpretation Date/Time:  Wednesday November 12 2022 08:58:54 EDT Ventricular Rate:  70 PR Interval:  190 QRS Duration:  98 QT Interval:  386 QTC Calculation: 416 R Axis:   -33  Text Interpretation: Normal sinus rhythm Left axis deviation Septal infarct (cited on or before 21-Mar-2001) When compared with ECG of 06-Nov-2004 08:38, axis is now leftward Confirmed by Rollene Rotunda ([id number]) on 11/12/2022 9:25:03 AM     Risk Assessment/Calculations:              Physical Exam:   VS:  BP 122/82   Pulse 70   Ht 6\' 2"  (1.88 m)   Wt 245 lb 12.8 oz  (111.5 kg)   SpO2 97%   BMI 31.56 kg/m    Wt Readings from Last 3 Encounters:  11/12/22 245 lb 12.8 oz (111.5 kg)  05/26/22 250 lb (113.4 kg)  06/07/21 249 lb 6.4 oz (113.1 kg)     GEN: Well nourished, well developed in no acute distress NECK: No JVD; possible  carotid bruits CARDIAC: RRR, no murmurs, rubs, gallops RESPIRATORY:  Clear to auscultation without rales, wheezing or rhonchi  ABDOMEN: Soft, non-tender, non-distended EXTREMITIES:  No edema; No deformity   ASSESSMENT AND PLAN:   Cardiomyopathy, dilated, nonischemic -  I will check an echocardiogram this year to follow-up.  It has been 4 years.  However, currently are not planning a change in medications.   Hypertension -  His blood pressure is at target.  No change in therapy.   Tobacco user -  He previously had a quit date but he did not quit.  He has got another quit date in mind and he understands importance of not smoking.  He does have some emphysema.   ETOH - He understands the need to completely abstain from alcohol as I thought this was probably the etiology of his cardiomyopathy in the past.    AAA - This was 3.1 CM in 2021.  No further imaging.  SOB - This is at baseline and not changed since his POET (Plain Old Exercise Treadmill).  No further imaging.   AORTIC ATHEROSCLEROSIS - We are participating with risk reduction.   BRUIT: I will check carotid Doppler     Follow up with me in about 18 months.   Signed, Rollene Rotunda, MD

## 2022-11-12 ENCOUNTER — Ambulatory Visit: Payer: Medicare PPO | Attending: Cardiology | Admitting: Cardiology

## 2022-11-12 ENCOUNTER — Encounter: Payer: Self-pay | Admitting: Cardiology

## 2022-11-12 VITALS — BP 122/82 | HR 70 | Ht 74.0 in | Wt 245.8 lb

## 2022-11-12 DIAGNOSIS — R0989 Other specified symptoms and signs involving the circulatory and respiratory systems: Secondary | ICD-10-CM | POA: Diagnosis not present

## 2022-11-12 DIAGNOSIS — I10 Essential (primary) hypertension: Secondary | ICD-10-CM

## 2022-11-12 DIAGNOSIS — R0602 Shortness of breath: Secondary | ICD-10-CM | POA: Diagnosis not present

## 2022-11-12 DIAGNOSIS — I420 Dilated cardiomyopathy: Secondary | ICD-10-CM

## 2022-11-12 DIAGNOSIS — I700 Atherosclerosis of aorta: Secondary | ICD-10-CM

## 2022-11-12 DIAGNOSIS — R059 Cough, unspecified: Secondary | ICD-10-CM | POA: Diagnosis not present

## 2022-11-12 NOTE — Patient Instructions (Signed)
Medication Instructions:  NO CHANGES  *If you need a refill on your cardiac medications before your next appointment, please call your pharmacy*   Testing/Procedures: Your physician has requested that you have an echocardiogram. Echocardiography is a painless test that uses sound waves to create images of your heart. It provides your doctor with information about the size and shape of your heart and how well your heart's chambers and valves are working. This procedure takes approximately one hour. There are no restrictions for this procedure. Please do NOT wear cologne, perfume, aftershave, or lotions (deodorant is allowed). Please arrive 15 minutes prior to your appointment time.  Your physician has requested that you have a carotid duplex. This test is an ultrasound of the carotid arteries in your neck. It looks at blood flow through these arteries that supply the brain with blood. Allow one hour for this exam. There are no restrictions or special instructions.  Both tests can be done at [facility] OR Echo -- [address] 3rd Floor Carotid -- Dr. Jenene Slicker office   Follow-Up: At [facility], you and your health needs are our priority.  As part of our continuing mission to provide you with exceptional heart care, we have created designated Provider Care Teams.  These Care Teams include your primary Cardiologist (physician) and Advanced Practice Providers (APPs -  Physician Assistants and Nurse Practitioners) who all work together to provide you with the care you need, when you need it.  We recommend signing up for the patient portal called "MyChart".  Sign up information is provided on this After Visit Summary.  MyChart is used to connect with patients for Virtual Visits (Telemedicine).  Patients are able to view lab/test results, encounter notes, upcoming appointments, etc.  Non-urgent messages can be sent to your provider as well.   To learn more about what you  can do with MyChart, go to [email or web address].    Your next appointment:    18 months with Dr. Antoine Poche

## 2022-11-19 DIAGNOSIS — N210 Calculus in bladder: Secondary | ICD-10-CM | POA: Diagnosis not present

## 2022-11-19 DIAGNOSIS — N200 Calculus of kidney: Secondary | ICD-10-CM | POA: Diagnosis not present

## 2022-11-27 DIAGNOSIS — G4733 Obstructive sleep apnea (adult) (pediatric): Secondary | ICD-10-CM | POA: Diagnosis not present

## 2022-12-03 ENCOUNTER — Ambulatory Visit (HOSPITAL_BASED_OUTPATIENT_CLINIC_OR_DEPARTMENT_OTHER): Payer: Medicare PPO

## 2022-12-03 ENCOUNTER — Ambulatory Visit (INDEPENDENT_AMBULATORY_CARE_PROVIDER_SITE_OTHER): Payer: Medicare PPO

## 2022-12-03 DIAGNOSIS — I420 Dilated cardiomyopathy: Secondary | ICD-10-CM | POA: Diagnosis not present

## 2022-12-03 DIAGNOSIS — R0989 Other specified symptoms and signs involving the circulatory and respiratory systems: Secondary | ICD-10-CM

## 2022-12-03 DIAGNOSIS — I700 Atherosclerosis of aorta: Secondary | ICD-10-CM

## 2022-12-03 DIAGNOSIS — R0602 Shortness of breath: Secondary | ICD-10-CM

## 2022-12-03 LAB — ECHOCARDIOGRAM COMPLETE
AR max vel: 2.16 cm2
AV Area VTI: 2.33 cm2
AV Area mean vel: 2.07 cm2
AV Mean grad: 3 mm[Hg]
AV Peak grad: 5.8 mm[Hg]
Ao pk vel: 1.2 m/s
Area-P 1/2: 4.68 cm2
S' Lateral: 2.96 cm

## 2022-12-11 ENCOUNTER — Encounter: Payer: Self-pay | Admitting: *Deleted

## 2022-12-12 DIAGNOSIS — E1169 Type 2 diabetes mellitus with other specified complication: Secondary | ICD-10-CM | POA: Diagnosis not present

## 2022-12-12 DIAGNOSIS — J432 Centrilobular emphysema: Secondary | ICD-10-CM | POA: Diagnosis not present

## 2022-12-12 DIAGNOSIS — I10 Essential (primary) hypertension: Secondary | ICD-10-CM | POA: Diagnosis not present

## 2022-12-12 DIAGNOSIS — E7800 Pure hypercholesterolemia, unspecified: Secondary | ICD-10-CM | POA: Diagnosis not present

## 2022-12-12 DIAGNOSIS — E119 Type 2 diabetes mellitus without complications: Secondary | ICD-10-CM | POA: Diagnosis not present

## 2022-12-12 DIAGNOSIS — L858 Other specified epidermal thickening: Secondary | ICD-10-CM | POA: Diagnosis not present

## 2022-12-12 DIAGNOSIS — I420 Dilated cardiomyopathy: Secondary | ICD-10-CM | POA: Diagnosis not present

## 2022-12-30 DIAGNOSIS — B078 Other viral warts: Secondary | ICD-10-CM | POA: Diagnosis not present

## 2022-12-30 DIAGNOSIS — L2989 Other pruritus: Secondary | ICD-10-CM | POA: Diagnosis not present

## 2022-12-30 DIAGNOSIS — D224 Melanocytic nevi of scalp and neck: Secondary | ICD-10-CM | POA: Diagnosis not present

## 2022-12-30 DIAGNOSIS — L821 Other seborrheic keratosis: Secondary | ICD-10-CM | POA: Diagnosis not present

## 2023-01-20 ENCOUNTER — Other Ambulatory Visit: Payer: Self-pay | Admitting: *Deleted

## 2023-01-20 DIAGNOSIS — J439 Emphysema, unspecified: Secondary | ICD-10-CM

## 2023-01-21 ENCOUNTER — Ambulatory Visit: Payer: Medicare PPO | Admitting: Pulmonary Disease

## 2023-01-21 ENCOUNTER — Encounter: Payer: Self-pay | Admitting: Pulmonary Disease

## 2023-01-21 VITALS — BP 132/72 | HR 64 | Temp 97.9°F | Ht 74.0 in | Wt 249.0 lb

## 2023-01-21 DIAGNOSIS — J439 Emphysema, unspecified: Secondary | ICD-10-CM | POA: Diagnosis not present

## 2023-01-21 DIAGNOSIS — F17210 Nicotine dependence, cigarettes, uncomplicated: Secondary | ICD-10-CM | POA: Diagnosis not present

## 2023-01-21 DIAGNOSIS — R053 Chronic cough: Secondary | ICD-10-CM

## 2023-01-21 DIAGNOSIS — J432 Centrilobular emphysema: Secondary | ICD-10-CM

## 2023-01-21 NOTE — Patient Instructions (Signed)
Continue breo inhaler 1 puff daily  Your breathing tests look normal!  Continue to work on cutting back on your daily cigarette use. Recommend using mini nicotine lozenges 2mg  as needed  I messaged our lung cancer screening team about getting your scan scheduled  Follow up in 1 year

## 2023-01-21 NOTE — Progress Notes (Signed)
Synopsis: Referred in November 2022 for Cough by Blair Heys, MD  Subjective:   PATIENT ID: Antonio Fuller GENDER: male DOB: [birth year], MRN: [id number]  HPI  Chief Complaint  Patient presents with   Follow-up    PFT today- sob-same, cough-occass. White, green, wheezing    Antonio Fuller is a 72 year old male, daily smoker with GERD, OSA on CPAP, hypertension and cardiomyopathy who returns to pulmonary clinic for chronic cough.   He was last seen by Rubye Oaks, NP on 05/26/22. The cough has remained relatively the same despite being on Breo inhaler. He reports that the [facility] inhaler seems to help and is used mostly towards the end of the day to ensure a good night's sleep. He has not been using the inhaler every day and has not noticed any worsening in symptoms. He reports that some days are worse than others.  In terms of smoking, he has been making efforts to reduce the number of cigarettes smoked per day. He has set rules and boundaries, limiting smoking to no more than an hour a day in a space where no one else is present. He has been successful in pushing this to two hours on most days. He reports smoking an average of six to seven cigarettes a day.  OV 03/13/21 He is tolerating the Breo Ellipta 100-25 MCG 1 puff daily better at this time.  He initially noted some heart racing but has not noticed it after continuing this medication.  He has good days and bad days with his breathing but overall he feels the cough is much better since taking the [facility].  Lung cancer screening CT chest 01/2021 showed mild centrilobular emphysema and a 2 mm right upper lobe nodule.  He is smoking 8 cigarettes daily at this time.  OV 12/28/2020 Patient reports increased cough, mucus production and intermittent wheezing since May of this year.  He was treated with doxycycline in July for acute bronchitis with improvement of his symptoms but no resolution.  He does report exertional dyspnea with 2  or more flights of stairs.  He denies seasonal allergies.  He denies any sinus congestion or postnasal drainage.  He had a deviated septum repaired in the past.  He denies any significant heartburn symptoms.  He currently works as a Comptroller at [employer], he has been married for 35 years.  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  He does report that his voice has become deeper over time.  He has been on lisinopril for many years.   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   Dyspnea on exertion    GERD (gastroesophageal reflux disease)    per pt drinks water   Hypercholesteremia    Hypertension    OSA on CPAP    Pre-diabetes    Tobacco user      Family History  Problem Relation Age of Onset   Hypertension Mother    Hypertension Father      Social History   Socioeconomic History   Marital status: Married    Spouse name: Not on file   Number of children: Not on file   Years of education: Not on file   Highest education level: Not on file  Occupational History   Occupation: Firefighter: [employer]  Tobacco Use   Smoking status: Every Day    Current packs/day: 1.00    Average packs/day: 1 pack/day for 40.0 years (40.0 ttl pk-yrs)    Types: Cigarettes   Smokeless tobacco: Never   Tobacco comments:    Smoking 6-8 per day currently.  Trying to quit.  05/26/2022 hfb  Vaping Use   Vaping status: Some Days  Substance and Sexual Activity   Alcohol use: Yes    Alcohol/week: 35.0 standard drinks of alcohol    Types: 35 Standard drinks or equivalent per week    Comment: 3-5 drinks per day   Drug use: No   Sexual activity: Not on file  Other Topics Concern   Not on file  Social History Narrative   Not on file   Social  Determinants of Health   Financial Resource Strain: Not on file  Food Insecurity: Not on file  Transportation Needs: Not on file  Physical Activity: Not on file  Stress: Not on file  Social Connections: Not on file  Intimate Partner Violence: Not on file     Allergies  Allergen Reactions   Chantix [Varenicline Tartrate] Other (See Comments)    Suicidal thoughts.     Outpatient Medications Prior to Visit  Medication Sig Dispense Refill   albuterol (VENTOLIN HFA) 108 (90 Base) MCG/ACT inhaler INHALE 2 PUFFS INTO THE LUNGS EVERY 6 HOURS AS NEEDED FOR WHEEZING OR SHORTNESS OF BREATH 6.7 g 3   amLODipine (NORVASC) 5 MG tablet Take 1 tablet every morning by mouth.      aspirin 81 MG tablet Take 81 mg by mouth daily.     atorvastatin (LIPITOR) 20 MG tablet Take 1 tablet (20 mg total) by mouth daily. 30 tablet 11   carvedilol (COREG) 25 MG tablet Take 1 tablet (25 mg total) by mouth 2 (two) times daily. 30 tablet 6   Cholecalciferol (D-3-5 PO) Take by mouth.     ezetimibe (ZETIA) 10 MG tablet TAKE 1 TABLET BY MOUTH ONCE DAILY 90 tablet 0   fluticasone furoate-vilanterol (BREO ELLIPTA) 200-25 MCG/ACT AEPB Inhale 1 puff into the lungs daily. 180 each 3   L-Methylfolate 15 MG TABS Take 15 mg by mouth daily.     losartan (COZAAR) 50 MG tablet Take 1 tablet (50 mg total) by mouth daily. 90 tablet 3   mirtazapine (REMERON) 30 MG tablet Take 30 mg by mouth at bedtime.     Multiple Vitamin (MULTIVITAMIN) tablet Take 1 tablet by mouth daily.     Potassium Chloride ER 20 MEQ TBCR Take 1 tablet by mouth daily.     tamsulosin (FLOMAX) 0.4 MG CAPS capsule Take 0.4 mg by mouth daily after breakfast.     TRINTELLIX 20 MG TABS tablet Take 20 mg by mouth daily.     No facility-administered medications prior to visit.    Review of Systems  Constitutional:  Negative for chills, fever, malaise/fatigue and weight loss.  HENT:  Negative for congestion, sinus pain and sore throat.   Eyes: Negative.    Respiratory:  Positive for cough, sputum production and shortness of breath. Negative for hemoptysis and wheezing.   Cardiovascular:  Negative for chest pain, palpitations, orthopnea, claudication and leg swelling.  Gastrointestinal:  Negative for abdominal pain, heartburn, nausea and vomiting.  Genitourinary: Negative.   Musculoskeletal:  Negative for joint pain and myalgias.  Skin:  Negative for rash.  Neurological:  Negative for  weakness.  Endo/Heme/Allergies: Negative.   Psychiatric/Behavioral: Negative.      Objective:   Vitals:   01/21/23 1018  BP: 132/72  Pulse: 64  Temp: 97.9 F (36.6 C)  TempSrc: Temporal  SpO2: 100%  Weight: 249 lb (112.9 kg)  Height: 6\' 2"  (1.88 m)    Physical Exam Constitutional:      General: He is not in acute distress. HENT:     Head: Normocephalic and atraumatic.  Eyes:     Extraocular Movements: Extraocular movements intact.     Conjunctiva/sclera: Conjunctivae normal.     Pupils: Pupils are equal, round, and reactive to light.  Cardiovascular:     Rate and Rhythm: Normal rate and regular rhythm.     Pulses: Normal pulses.     Heart sounds: Normal heart sounds. No murmur heard. Pulmonary:     Effort: Pulmonary effort is normal.     Breath sounds: Decreased breath sounds present. No wheezing, rhonchi or rales.  Abdominal:     General: Bowel sounds are normal.     Palpations: Abdomen is soft.  Musculoskeletal:     Right lower leg: No edema.     Left lower leg: No edema.  Lymphadenopathy:     Cervical: No cervical adenopathy.  Skin:    General: Skin is warm and dry.  Neurological:     General: No focal deficit present.     Mental Status: He is alert.  Psychiatric:        Mood and Affect: Mood normal.        Behavior: Behavior normal.        Thought Content: Thought content normal.        Judgment: Judgment normal.    CBC    Component Value Date/Time   HGB 15.3 01/02/2017 0713   HCT 45.0 01/02/2017 0713     Chest  imaging: CT Chest LCS 01/31/21 Mediastinum/Nodes: Esophagus and thyroid are unremarkable. No pathologically enlarged lymph nodes seen in the chest.   Lungs/Pleura: Central airways are patent. Mild centrilobular emphysema. No consolidation, pleural effusion or pneumothorax. Small solid pulmonary nodule of the right upper lobe measuring 2 mm on image 105.  CXR 09/07/2020 Lung volumes are normal. Mild diffuse peribronchial cuffing. No consolidative airspace disease. No pleural effusions. No pneumothorax. No pulmonary nodule or mass noted. Pulmonary vasculature and the cardiomediastinal silhouette are within normal limits. Atherosclerosis in the thoracic aorta.  PFT:     No data to display          Labs:  Path:  Echo 05/04/2019: LVEF 55-60%. Grade I diastolic dysfunction. RV size is normal.   Heart Catheterization:  Assessment & Plan:   Centrilobular emphysema (HCC)  Cigarette smoker  Discussion: Antonio Fuller is a 72 year old male, daily smoker with GERD, OSA on CPAP, hypertension and cardiomyopathy who returns to pulmonary clinic for chronic cough.   Centrilobular Emphysema - Evidence on CT chest imaging but PFTs unremarkable.   Chronic Cough Persistent despite use of Breo. No significant worsening of symptoms with intermittent use of additional inhaler. Pulmonary function tests within normal range. -Continue Breo daily at the same time for consistency.  Tobacco Use Smoking 6-7 cigarettes per day, down from 8 per day. Actively working on reducing frequency and setting boundaries. -Encouraged to continue current efforts to reduce smoking frequency. -Suggested use of nicotine lozenges as a substitute for cigarettes during designated smoking times.  Lung Cancer Screening Due for lung cancer screening, but not yet scheduled. -Message sent to  team to schedule lung cancer screening.  Follow-up in 1 year.  Melody Comas, MD [facility] Office: [phone]   Current Outpatient Medications:    albuterol (VENTOLIN HFA) 108 (90 Base) MCG/ACT inhaler, INHALE 2 PUFFS INTO THE LUNGS EVERY 6 HOURS AS NEEDED FOR WHEEZING OR SHORTNESS OF BREATH, Disp: 6.7 g, Rfl: 3   amLODipine (NORVASC) 5 MG tablet, Take 1 tablet every morning by mouth. , Disp: , Rfl:    aspirin 81 MG tablet, Take 81 mg by mouth daily., Disp: , Rfl:    atorvastatin (LIPITOR) 20 MG tablet, Take 1 tablet (20 mg total) by mouth daily., Disp: 30 tablet, Rfl: 11   carvedilol (COREG) 25 MG tablet, Take 1 tablet (25 mg total) by mouth 2 (two) times daily., Disp: 30 tablet, Rfl: 6   Cholecalciferol (D-3-5 PO), Take by mouth., Disp: , Rfl:    ezetimibe (ZETIA) 10 MG tablet, TAKE 1 TABLET BY MOUTH ONCE DAILY, Disp: 90 tablet, Rfl: 0   fluticasone furoate-vilanterol (BREO ELLIPTA) 200-25 MCG/ACT AEPB, Inhale 1 puff into the lungs daily., Disp: 180 each, Rfl: 3   L-Methylfolate 15 MG TABS, Take 15 mg by mouth daily., Disp: , Rfl:    losartan (COZAAR) 50 MG tablet, Take 1 tablet (50 mg total) by mouth daily., Disp: 90 tablet, Rfl: 3   mirtazapine (REMERON) 30 MG tablet, Take 30 mg by mouth at bedtime., Disp: , Rfl:    Multiple Vitamin (MULTIVITAMIN) tablet, Take 1 tablet by mouth daily., Disp: , Rfl:    Potassium Chloride ER 20 MEQ TBCR, Take 1 tablet by mouth daily., Disp: , Rfl:    tamsulosin (FLOMAX) 0.4 MG CAPS capsule, Take 0.4 mg by mouth daily after breakfast., Disp: , Rfl:    TRINTELLIX 20 MG TABS tablet, Take 20 mg by mouth daily., Disp: , Rfl:

## 2023-01-21 NOTE — Progress Notes (Signed)
Full PFT performed today. °

## 2023-01-21 NOTE — Patient Instructions (Signed)
Full PFT performed today. °

## 2023-01-22 ENCOUNTER — Other Ambulatory Visit: Payer: Self-pay | Admitting: *Deleted

## 2023-01-22 DIAGNOSIS — Z87891 Personal history of nicotine dependence: Secondary | ICD-10-CM

## 2023-01-22 DIAGNOSIS — F17210 Nicotine dependence, cigarettes, uncomplicated: Secondary | ICD-10-CM

## 2023-01-22 DIAGNOSIS — Z122 Encounter for screening for malignant neoplasm of respiratory organs: Secondary | ICD-10-CM

## 2023-02-05 ENCOUNTER — Ambulatory Visit
Admission: RE | Admit: 2023-02-05 | Discharge: 2023-02-05 | Disposition: A | Discharge status: Home / Self Care | Payer: Medicare PPO | Source: Ambulatory Visit | Attending: Acute Care | Admitting: Acute Care

## 2023-02-05 DIAGNOSIS — F17210 Nicotine dependence, cigarettes, uncomplicated: Secondary | ICD-10-CM

## 2023-02-05 DIAGNOSIS — Z87891 Personal history of nicotine dependence: Secondary | ICD-10-CM

## 2023-02-05 DIAGNOSIS — Z122 Encounter for screening for malignant neoplasm of respiratory organs: Secondary | ICD-10-CM

## 2023-02-12 LAB — PULMONARY FUNCTION TEST
DL/VA % pred: 105 %
DL/VA: 4.18 ml/min/mmHg/L
DLCO cor % pred: 103 %
DLCO cor: 30.06 ml/min/mmHg
DLCO unc % pred: 103 %
DLCO unc: 30.06 ml/min/mmHg
FEF 25-75 Post: 3.21 L/s
FEF 25-75 Pre: 2.51 L/s
FEF2575-%Change-Post: 27 %
FEF2575-%Pred-Post: 114 %
FEF2575-%Pred-Pre: 89 %
FEV1-%Change-Post: 5 %
FEV1-%Pred-Post: 91 %
FEV1-%Pred-Pre: 85 %
FEV1-Post: 3.41 L
FEV1-Pre: 3.22 L
FEV1FVC-%Change-Post: 0 %
FEV1FVC-%Pred-Pre: 102 %
FEV6-%Change-Post: 6 %
FEV6-%Pred-Post: 94 %
FEV6-%Pred-Pre: 88 %
FEV6-Post: 4.55 L
FEV6-Pre: 4.27 L
FEV6FVC-%Change-Post: 0 %
FEV6FVC-%Pred-Post: 105 %
FEV6FVC-%Pred-Pre: 105 %
FVC-%Change-Post: 6 %
FVC-%Pred-Post: 89 %
FVC-%Pred-Pre: 84 %
FVC-Post: 4.56 L
FVC-Pre: 4.27 L
Post FEV1/FVC ratio: 75 %
Post FEV6/FVC ratio: 100 %
Pre FEV1/FVC ratio: 75 %
Pre FEV6/FVC Ratio: 100 %
RV % pred: 112 %
RV: 3.02 L
TLC % pred: 94 %
TLC: 7.44 L

## 2023-02-19 ENCOUNTER — Other Ambulatory Visit: Payer: Self-pay

## 2023-02-19 DIAGNOSIS — Z87891 Personal history of nicotine dependence: Secondary | ICD-10-CM

## 2023-02-19 DIAGNOSIS — F17210 Nicotine dependence, cigarettes, uncomplicated: Secondary | ICD-10-CM

## 2023-02-19 DIAGNOSIS — Z122 Encounter for screening for malignant neoplasm of respiratory organs: Secondary | ICD-10-CM

## 2023-06-06 ENCOUNTER — Other Ambulatory Visit: Payer: Self-pay | Admitting: Adult Health

## 2023-06-06 DIAGNOSIS — R053 Chronic cough: Secondary | ICD-10-CM

## 2023-08-07 ENCOUNTER — Other Ambulatory Visit: Payer: Self-pay | Admitting: Family Medicine

## 2023-08-07 DIAGNOSIS — R200 Anesthesia of skin: Secondary | ICD-10-CM

## 2023-08-11 ENCOUNTER — Ambulatory Visit
Admission: RE | Admit: 2023-08-11 | Discharge: 2023-08-11 | Disposition: A | Discharge status: Home / Self Care | Source: Ambulatory Visit | Attending: Family Medicine | Admitting: Family Medicine

## 2023-08-11 DIAGNOSIS — R200 Anesthesia of skin: Secondary | ICD-10-CM

## 2023-08-12 ENCOUNTER — Other Ambulatory Visit: Payer: Self-pay | Admitting: Family Medicine

## 2023-08-12 DIAGNOSIS — Z828 Family history of other disabilities and chronic diseases leading to disablement, not elsewhere classified: Secondary | ICD-10-CM

## 2023-08-19 ENCOUNTER — Ambulatory Visit
Admission: RE | Admit: 2023-08-19 | Discharge: 2023-08-19 | Disposition: A | Discharge status: Home / Self Care | Source: Ambulatory Visit | Attending: Family Medicine | Admitting: Family Medicine

## 2023-08-19 DIAGNOSIS — Z828 Family history of other disabilities and chronic diseases leading to disablement, not elsewhere classified: Secondary | ICD-10-CM

## 2023-08-19 MED ORDER — IOPAMIDOL (ISOVUE-370) INJECTION 76%
75.0000 mL | Freq: Once | INTRAVENOUS | Status: AC | PRN
  Administered 2023-08-19: 75 mL via INTRAVENOUS

## 2023-08-28 ENCOUNTER — Encounter: Payer: Self-pay | Admitting: Neurology

## 2023-08-31 ENCOUNTER — Ambulatory Visit: Attending: Family Medicine | Admitting: Audiologist

## 2023-08-31 DIAGNOSIS — H903 Sensorineural hearing loss, bilateral: Secondary | ICD-10-CM | POA: Diagnosis present

## 2023-08-31 NOTE — Procedures (Signed)
  Outpatient Audiology and [facility] [address] [place], [place]  [address] [phone]  AUDIOLOGICAL  EVALUATION  NAME: Nirvan Laban Canevari     DOB:   12-19-1950      MRN: [id number]                                                                                     DATE: 08/31/2023     REFERENT: Leonel Cole, MD STATUS: Outpatient DIAGNOSIS: Mild Sensorineural Hearing Loss Bilateral    History: Joey was seen for an audiological evaluation due to difficulty hearing in noisy places and difficulty hearing a specific person at the grocery store.  Jailen denies pain, pressure, or tinnitus. He does have occasional high pitched ringing that last a short period.  Magic has no history of hazardous noise exposure.  Medical history shows no additional risk for hearing loss.    Evaluation:  Otoscopy showed a clear view of the tympanic membranes, bilaterally Tympanometry results were consistent with normal middle ear function, bilaterally   Audiometric testing was completed using Conventional Audiometry techniques with insert earphones and supraural headphones. Test results are consistent with normal hearing sloping to mild hearing loss 6-8kHz bilaterally. Speech Recognition Thresholds were obtained at  10dB HL in the right ear and at 15dB HL in the left ear. Word Recognition Testing was completed at  40dB SL and Jacey scored 100% in each ear.   Quick Speech in Noise Test (QuickSIN):  list of six sentences with five key words per sentence is presented in four-talker babble noise. Feras scored 0.5dB SNR, normal ability to hear in noise.    Results:  The test results were reviewed with Deward. He has a mild hearing loss in both ears at the highest pitches. Mosiah does not need hearing aids. He has normal ability to hear in noise. Likely fatigue and poor listening environments are the driver of his difficulty.  Audiogram printed and provided to [place].    Recommendations: No further testing is  recommended at this time, if symptoms worsen or new difficulty arises then follow up recommended.     25 minutes spent testing and counseling on results.   If you have any questions please feel free to contact me at [phone].  Lauraine Ka Stalnaker Au.D.  Audiologist   08/31/2023  1:35 PM  Cc: Leonel Cole, MD

## 2023-09-28 DIAGNOSIS — Z8042 Family history of malignant neoplasm of prostate: Secondary | ICD-10-CM | POA: Insufficient documentation

## 2023-09-30 ENCOUNTER — Encounter: Payer: Self-pay | Admitting: Neuroradiology

## 2023-09-30 ENCOUNTER — Ambulatory Visit: Admitting: Neuroradiology

## 2023-09-30 VITALS — BP 114/80 | HR 73 | Ht 74.0 in | Wt 235.0 lb

## 2023-09-30 DIAGNOSIS — I6503 Occlusion and stenosis of bilateral vertebral arteries: Secondary | ICD-10-CM | POA: Diagnosis not present

## 2023-09-30 NOTE — Progress Notes (Signed)
 Chief Complaint: Patient was seen in consultation today for  Chief Complaint  Patient presents with   Referral    Pt states that he has pressure, itching, tingling and numbness on the right side... It does seem to be getting less   at the request of Hammer,Eli  Referring Physician(s): Hammer,Eli  History of Present Illness: Antonio Fuller is a 73 y.o. male .He had a CT/CTA for some right facial numbness and tingling.  The report indicates bilateral severe V4 segment vertebral artery stenosis. I reviewed the CT/CTA from 08/11/23 and 08/19/23. There is some calcified plaque in the intracranial vertebral arteries. There is no severe stenosis.   He is not having symptoms of vertebrobasilar TIAs.    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   Dyspnea on exertion    GERD (gastroesophageal reflux disease)    per pt drinks water   Hypercholesteremia    Hypertension    OSA on CPAP    Pre-diabetes    Tobacco user     Past Surgical History:  Procedure Laterality Date   APPENDECTOMY  1990s   CARDIAC CATHETERIZATION  03-22-2001  dr elpidio   severe LV dysfunction in a global matter;  normal coronaries, ef 20%   COLONOSCOPY  last one 2016   EVALUATION UNDER ANESTHESIA WITH HEMORRHOIDECTOMY N/A 01/02/2017   Procedure: ANORECTAL EXAM UNDER ANESTHESIA WITH HEMORRHOIDECTOMY AND HEMORRHOIDAL LIGATION/PEXY X2;  Surgeon: Sheldon Standing, MD;  Location: [facility];  Service: General;  Laterality: N/A;  GENERAL AND LOCAL   HEMORRHOID SURGERY  2008 approx.   MASS EXCISION Left 01/02/2017   Procedure: REMOVAL OF MASS ON LEFT UPPER GLUTEUS;  Surgeon: Sheldon Standing, MD;  Location: [facility];  Service: General;  Laterality: Left;  GENERAL AND LOCAL   TONSILLECTOMY  child   TRANSTHORACIC ECHOCARDIOGRAM  02-23-2014  dr hochrein   mild focal hypertrophy of the septum, ef 45-50%,  grade 1  diastolic dysfunction/  trivial MR and TR (no change when compared to last study 02-20-2010)   UMBILICAL HERNIA REPAIR  1990s    Allergies: Chantix [varenicline tartrate]  Medications: Prior to Admission medications   Medication Sig Start Date End Date Taking? Authorizing Provider  Albuterol  Sulfate (PROAIR  RESPICLICK) 108 (90 Base) MCG/ACT AEPB Take 1 puff by mouth every 4 (four) hours as needed.   Yes [provider]  amLODipine (NORVASC) 5 MG tablet Take 1 tablet every morning by mouth.  03/21/16  Yes [provider]  aspirin 81 MG tablet Take 81 mg by mouth daily.   Yes [provider]  Aspirin-Calcium  Carbonate 81-777 MG TABS Take 81 mg by mouth daily. 02/06/16  Yes [provider]  AUVELITY 45-105 MG TBCR Take 45 tablets by mouth 2 (two) times daily. 09/28/23  Yes [provider]  BREO ELLIPTA  200-25 MCG/ACT AEPB INHALE 1 PUFF INTO THE LUNGS DAILY 06/06/23  Yes Kara Dorn NOVAK, MD  carvedilol  (COREG ) 25 MG tablet Take 1 tablet (25 mg total) by mouth 2 (two) times daily. 01/19/17  Yes Lavona Agent, MD  Cholecalciferol (D-3-5 PO) Take by mouth.   Yes [provider]  ezetimibe  (ZETIA ) 10 MG tablet TAKE 1 TABLET BY MOUTH ONCE DAILY 10/13/17  Yes Lavona Agent, MD  L-Methylfolate 15 MG TABS Take 15 mg by mouth daily.   Yes [provider]  L-Methylfolate-Algae (DEPLIN 15) 15-90.314 MG CAPS Take 1 capsule  by mouth daily.   Yes [provider]  losartan  (COZAAR ) 50 MG tablet Take 1 tablet (50 mg total) by mouth daily. 06/07/21 09/30/23 Yes Lavona Agent, MD  mirtazapine (REMERON) 30 MG tablet Take 30 mg by mouth at bedtime. 12/24/20  Yes [provider]  Multiple Vitamin (MULTIVITAMIN) tablet Take 1 tablet by mouth daily.   Yes [provider]  Potassium Chloride  ER 20 MEQ TBCR Take 1 tablet by mouth daily. 12/12/20  Yes [provider]  TRINTELLIX 20 MG TABS tablet Take 20 mg by mouth daily.  02/25/21  Yes [provider]  atorvastatin  (LIPITOR) 20 MG tablet Take 1 tablet (20 mg total) by mouth daily. Patient not taking: Reported on 09/30/2023 05/19/16   Lavona Agent, MD  digoxin  (LANOXIN ) 0.25 MG tablet Take 250 mcg by mouth daily. Patient not taking: Reported on 09/30/2023 05/31/15   [provider]  lisinopril  (ZESTRIL ) 40 MG tablet Take 40 mg by mouth daily. Patient not taking: Reported on 09/30/2023 02/06/16   [provider]  Multiple Vitamin (MULTIVITAMINS PO) as directed Orally once a day Patient not taking: Reported on 09/30/2023    [provider]  PARoxetine  (PAXIL -CR) 25 MG 24 hr tablet Take 25 mg by mouth daily. Patient not taking: Reported on 09/30/2023 04/15/12   [provider]  potassium chloride  SA (KLOR-CON  M) 20 MEQ tablet Take 20 mEq by mouth once. Patient not taking: Reported on 09/30/2023    [provider]  tamsulosin (FLOMAX) 0.4 MG CAPS capsule Take 0.4 mg by mouth daily after breakfast. Patient not taking: Reported on 09/30/2023    [provider]     Family History  Problem Relation Age of Onset   Hypertension Mother    Hypertension Father     Social History   Socioeconomic History   Marital status: Married    Spouse name: Not on file   Number of children: Not on file   Years of education: Not on file   Highest education level: Not on file  Occupational History   Occupation: Librarian    Employer: [employer]  Tobacco Use   Smoking status: Every Day    Current packs/day: 1.00    Average packs/day: 1 pack/day for 40.0 years (40.0 ttl pk-yrs)    Types: Cigarettes   Smokeless tobacco: Never   Tobacco comments:    Smoking 6-8 per day currently.  Trying to quit.  05/26/2022 hfb  Vaping Use   Vaping status: Some Days  Substance and Sexual Activity   Alcohol use: Yes    Alcohol/week: 35.0 standard drinks of alcohol    Types: 35 Standard drinks or equivalent per week    Comment: 3-5  drinks per day   Drug use: No   Sexual activity: Not on file  Other Topics Concern   Not on file  Social History Narrative   Not on file   Social Drivers of Health   Financial Resource Strain: Not on file  Food Insecurity: Not on file  Transportation Needs: Not on file  Physical Activity: Not on file  Stress: Not on file  Social Connections: Not on file      Vital Signs: BP 114/80   Pulse 73   Ht 6' 2 (1.88 m)   Wt 235 lb (106.6 kg)   SpO2 96%   BMI 30.17 kg/m     Imaging: No results found.  Labs:  CBC: No results for input(s): WBC, HGB, HCT, PLT in the last  8760 hours.  COAGS: No results for input(s): INR, APTT in the last 8760 hours.  BMP: No results for input(s): NA, K, CL, CO2, GLUCOSE, BUN, CALCIUM , CREATININE, GFRNONAA, GFRAA in the last 8760 hours.  Invalid input(s): CMP  LIVER FUNCTION TESTS: No results for input(s): BILITOT, AST, ALT, ALKPHOS, PROT, ALBUMIN in the last 8760 hours.  TUMOR MARKERS: No results for input(s): AFPTM, CEA, CA199, CHROMGRNA in the last 8760 hours.  Assessment:  Asymptomatic vertebral artery atherosclerosis  Plan:  Medical treatment of vascular risk factors. 81 mg aspirin. No intervention or imaging follow up needed.   Electronically Signed: Nancyann LULLA Burns  09/30/2023, 4:41 PM

## 2023-10-27 ENCOUNTER — Encounter: Payer: Self-pay | Admitting: Neurology

## 2023-10-27 ENCOUNTER — Other Ambulatory Visit

## 2023-10-27 ENCOUNTER — Ambulatory Visit: Admitting: Neurology

## 2023-10-27 VITALS — BP 131/86 | HR 94 | Ht 74.0 in | Wt 233.0 lb

## 2023-10-27 DIAGNOSIS — R202 Paresthesia of skin: Secondary | ICD-10-CM

## 2023-10-27 DIAGNOSIS — G629 Polyneuropathy, unspecified: Secondary | ICD-10-CM

## 2023-10-27 NOTE — Progress Notes (Signed)
 [facility] Neurology [facility] Clinic Note - Initial Visit   Date: 10/27/2023   Antonio Fuller MRN: [id number] DOB: 06/22/1950   Dear Dr. Leonel:  Thank you for your kind referral of Antonio Fuller for consultation of right facial numbness. Although his history is well known to you, please allow us  to reiterate it for the purpose of our medical record. The patient was accompanied to the clinic by wife who also provides collateral information.     Antonio Fuller is a 73 y.o. right-handed male with hyperlipidemia, hypertension, GERD, and tobacco use presenting for evaluation of right facial numbness.   IMPRESSION/PLAN: Right facial paresthesias and scalp pruritus.  Symptoms have largely resolved.  Based on his history, symptoms are not consistent with classic trigeminal neuralgia, as he did not have severe pain.  He still may have some irritation to the trigeminal nerve causing sensory changes.  Today, neurological exam shows normal facial sensation and strength.  CT head was personally reviewed and does not show any structural pathology to explain his symptoms.  To be complete, I will check vitamin B12, folate, vitamin B1, SPEP with IFE.  Given that symptoms have resolved, will hold on MRI brain.  Bilateral vertebral stenosis would not cause his right facial paresthesias.  He does not have vertrobasilar symptoms. Continue aspirin 81mg .  Tobacco cessation discussed.  Neuropathy affecting the feet.  Risk factors:  alcohol use.  Check labs as above.  Recommend limiting alcohol consumption.   Return to clinic as needed  ------------------------------------------------------------- History of present illness: Starting mid-February 2024, he developed right facial numbness >>tingling  involving the forehead, cheek, jaw, and ear.  He also has severe itching over the right side scalp, periauricular region.  Symptoms were constant for about a year and gradually improved since early 2025.   He saw PCP and dermatologist and had CT/A head.  CT head shows age-related changes and CTA shows bilateral vertebral stenosis.  He saw neurosurgery for these changes who recommended medical management.  He denies headaches, changes in vision, difficulty with swallowing/speech.     Out-side paper records, electronic medical record, and images have been reviewed where available and summarized as:  CT head wo contrast 08/17/2023: 1. Age related atrophy and chronic white matter microvascular changes. 2. No acute intracranial abnormality by noncontrast CT. 3. Scattered ethmoid sinus mucosal thickening.   CTA head 08/23/2023: Evaluation is limited due to contrast bolus timing. The intracranial internal carotid arteries are particularly limited from the petrous to the cavernous segments. Additional limited evaluation of distal cerebral artery branches.   Within these limitations there is no evidence of proximal occlusion.   Atherosclerosis as above. Focal severe stenosis of the V4 segment right vertebral artery. Additional moderate stenosis of the V4 segment left vertebral artery. Moderate stenosis of the left cavernous ICA.   No CT evidence of acute intracranial abnormality.   Chronic microvascular ischemic changes.   Aortic Atherosclerosis (ICD10-I70.0).   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   Dyspnea on exertion    GERD (gastroesophageal reflux disease)    per pt drinks water   Hypercholesteremia    Hypertension    OSA on CPAP    Pre-diabetes    Tobacco user  Past Surgical History:  Procedure Laterality Date   APPENDECTOMY  1990s   CARDIAC CATHETERIZATION  03-22-2001  dr elpidio   severe LV dysfunction in a  global matter;  normal coronaries, ef 20%   COLONOSCOPY  last one 2016   EVALUATION UNDER ANESTHESIA WITH HEMORRHOIDECTOMY N/A 01/02/2017   Procedure: ANORECTAL EXAM UNDER ANESTHESIA WITH HEMORRHOIDECTOMY AND HEMORRHOIDAL LIGATION/PEXY X2;  Surgeon: Sheldon Standing, MD;  Location: [facility];  Service: General;  Laterality: N/A;  GENERAL AND LOCAL   HEMORRHOID SURGERY  2008 approx.   MASS EXCISION Left 01/02/2017   Procedure: REMOVAL OF MASS ON LEFT UPPER GLUTEUS;  Surgeon: Sheldon Standing, MD;  Location: [facility];  Service: General;  Laterality: Left;  GENERAL AND LOCAL   TONSILLECTOMY  child   TRANSTHORACIC ECHOCARDIOGRAM  02-23-2014  dr hochrein   mild focal hypertrophy of the septum, ef 45-50%,  grade 1 diastolic dysfunction/  trivial MR and TR (no change when compared to last study 02-20-2010)   UMBILICAL HERNIA REPAIR  1990s     Medications:  Outpatient Encounter Medications as of 10/27/2023  Medication Sig   Albuterol  Sulfate (PROAIR  RESPICLICK) 108 (90 Base) MCG/ACT AEPB Take 1 puff by mouth every 4 (four) hours as needed.   allopurinol (ZYLOPRIM) 300 MG tablet Take 300 mg by mouth daily.   amLODipine (NORVASC) 5 MG tablet Take 1 tablet every morning by mouth.    aspirin 81 MG tablet Take 81 mg by mouth daily.   atorvastatin  (LIPITOR) 20 MG tablet Take 1 tablet (20 mg total) by mouth daily.   AUVELITY 45-105 MG TBCR Take 45 tablets by mouth 2 (two) times daily.   BREO ELLIPTA  200-25 MCG/ACT AEPB INHALE 1 PUFF INTO THE LUNGS DAILY   carvedilol  (COREG ) 25 MG tablet Take 1 tablet (25 mg total) by mouth 2 (two) times daily.   Cholecalciferol (D-3-5 PO) Take by mouth.   ezetimibe  (ZETIA ) 10 MG tablet TAKE 1 TABLET BY MOUTH ONCE DAILY   L-Methylfolate 15 MG TABS Take 15 mg by mouth daily.   L-Methylfolate-Algae (DEPLIN 15) 15-90.314 MG CAPS Take 1 capsule by mouth daily.   losartan  (COZAAR ) 50 MG tablet Take 1 tablet (50 mg total) by mouth daily.    mirtazapine (REMERON) 30 MG tablet Take 30 mg by mouth at bedtime.   Multiple Vitamin (MULTIVITAMIN) tablet Take 1 tablet by mouth daily.   Potassium Chloride  ER 20 MEQ TBCR Take 1 tablet by mouth daily.   Aspirin-Calcium  Carbonate 81-777 MG TABS Take 81 mg by mouth daily.   digoxin  (LANOXIN ) 0.25 MG tablet Take 250 mcg by mouth daily. (Patient not taking: Reported on 09/30/2023)   lisinopril  (ZESTRIL ) 40 MG tablet Take 40 mg by mouth daily. (Patient not taking: Reported on 10/27/2023)   Multiple Vitamin (MULTIVITAMINS PO) as directed Orally once a day (Patient not taking: Reported on 09/30/2023)   PARoxetine  (PAXIL -CR) 25 MG 24 hr tablet Take 25 mg by mouth daily. (Patient not taking: Reported on 09/30/2023)   potassium chloride  SA (KLOR-CON  M) 20 MEQ tablet Take 20 mEq by mouth once. (Patient not taking: Reported on 09/30/2023)   tamsulosin (FLOMAX) 0.4 MG CAPS capsule Take 0.4 mg by mouth daily after breakfast. (Patient not taking: Reported on 09/30/2023)   TRINTELLIX 20 MG TABS tablet Take 20 mg by mouth daily.   No facility-administered encounter medications on file as of 10/27/2023.    Allergies:  Allergies  Allergen Reactions   Chantix [Varenicline Tartrate] Other (See Comments)  Suicidal thoughts.    Family History: Family History  Problem Relation Age of Onset   Hypertension Mother    Hypertension Father     Social History: Social History   Tobacco Use   Smoking status: Every Day    Current packs/day: 1.00    Average packs/day: 1 pack/day for 40.0 years (40.0 ttl pk-yrs)    Types: Cigarettes   Smokeless tobacco: Never   Tobacco comments:    Smoking 6-8 per day currently.  Trying to quit.  05/26/2022 hfb    10/27/23- 6 cigarettes a day   Vaping Use   Vaping status: Some Days  Substance Use Topics   Alcohol use: Yes    Alcohol/week: 35.0 standard drinks of alcohol    Types: 35 Standard drinks or equivalent per week    Comment: 3-5 drinks per day   Drug use: No   Social  History   Social History Narrative   Are you right handed or left handed? Right handed    Are you currently employed ? No    What is your current occupation? Retired   Do you live at home alone? No    Who lives with you? Wife - Jan    What type of home do you live in: 1 story or 2 story? Lives in a two story home        Vital Signs:  BP 131/86   Pulse 94   Ht 6' 2 (1.88 m)   Wt 233 lb (105.7 kg)   SpO2 97%   BMI 29.92 kg/m    Neurological Exam: MENTAL STATUS including orientation to time, place, person, recent and remote memory, attention span and concentration, language, and fund of knowledge is normal.  Speech is not dysarthric.  CRANIAL NERVES: II:  No visual field defects.     III-IV-VI: Pupils equal round and reactive to light.  Normal conjugate, extra-ocular eye movements in all directions of gaze.  No nystagmus.  No ptosis.   V:  Normal facial sensation.    VII:  Normal facial symmetry and movements.   VIII:  Normal hearing and vestibular function.   IX-X:  Normal palatal movement.   XI:  Normal shoulder shrug and head rotation.   XII:  Normal tongue strength and range of motion, no deviation or fasciculation.  MOTOR:  Motor strength is 5/5 throughout.  No atrophy, fasciculations or abnormal movements.  No pronator drift.   MSRs:                                           Right        Left brachioradialis 2+  2+  biceps 2+  2+  triceps 2+  2+  patellar 2+  2+  ankle jerk 0  0  plantar response down  down   SENSORY:  Reduced vibration and temperature below the ankles bilaterally.   COORDINATION/GAIT: Normal finger-to- nose-finger.  Intact rapid alternating movements bilaterally.  Able to rise from a chair without using arms.  Gait narrow based and stable. Tandem and stressed gait intact.      Thank you for allowing me to participate in patient's care.  If I can answer any additional questions, I would be pleased to do so.    Sincerely,    Aliena Ghrist K.  Tobie, DO

## 2023-11-04 LAB — B12 AND FOLATE PANEL
Folate: >24 ng/mL
Vitamin B-12: 341 pg/mL (ref 200–1100)

## 2023-11-04 LAB — PROTEIN ELECTROPHORESIS, SERUM
Albumin ELP: 4.4 g/dL (ref 3.8–4.8)
Alpha 1: 0.3 g/dL (ref 0.2–0.3)
Alpha 2: 0.6 g/dL (ref 0.5–0.9)
Beta 2: 0.2 g/dL (ref 0.2–0.5)
Beta Globulin: 0.4 g/dL (ref 0.4–0.6)
Gamma Globulin: 0.8 g/dL (ref 0.8–1.7)
Total Protein: 6.7 g/dL (ref 6.1–8.1)

## 2023-11-04 LAB — IMMUNOFIXATION ELECTROPHORESIS
IgG (Immunoglobin G), Serum: 934 mg/dL (ref 600–1540)
IgM, Serum: 56 mg/dL (ref 50–300)
Immunoglobulin A: 80 mg/dL (ref 70–320)

## 2023-11-04 LAB — VITAMIN B1: Vitamin B1 (Thiamine): 21 nmol/L (ref 8–30)

## 2023-11-05 ENCOUNTER — Ambulatory Visit: Payer: Self-pay | Admitting: Neurology

## 2024-02-08 ENCOUNTER — Inpatient Hospital Stay: Admission: RE | Admit: 2024-02-08 | Discharge: 2024-02-08 | Attending: Acute Care | Admitting: Acute Care

## 2024-02-08 DIAGNOSIS — Z122 Encounter for screening for malignant neoplasm of respiratory organs: Secondary | ICD-10-CM

## 2024-02-08 DIAGNOSIS — F17210 Nicotine dependence, cigarettes, uncomplicated: Secondary | ICD-10-CM

## 2024-02-08 DIAGNOSIS — Z87891 Personal history of nicotine dependence: Secondary | ICD-10-CM

## 2024-02-17 ENCOUNTER — Other Ambulatory Visit: Payer: Self-pay | Admitting: Acute Care

## 2024-02-17 DIAGNOSIS — Z87891 Personal history of nicotine dependence: Secondary | ICD-10-CM

## 2024-02-17 DIAGNOSIS — F17210 Nicotine dependence, cigarettes, uncomplicated: Secondary | ICD-10-CM

## 2024-02-17 DIAGNOSIS — Z122 Encounter for screening for malignant neoplasm of respiratory organs: Secondary | ICD-10-CM
